# Patient Record
Sex: Female | Born: 1990 | Race: White | Hispanic: No | Marital: Single | Smoking: Never smoker
Health system: Southern US, Community
[De-identification: ages and names within clinical notes are randomized; demographics above are authoritative.]

## PROBLEM LIST (undated history)

## (undated) DIAGNOSIS — T8859XA Other complications of anesthesia, initial encounter: Secondary | ICD-10-CM

## (undated) DIAGNOSIS — Z9889 Other specified postprocedural states: Secondary | ICD-10-CM

## (undated) DIAGNOSIS — F411 Generalized anxiety disorder: Secondary | ICD-10-CM

## (undated) DIAGNOSIS — T4145XA Adverse effect of unspecified anesthetic, initial encounter: Secondary | ICD-10-CM

## (undated) DIAGNOSIS — R112 Nausea with vomiting, unspecified: Secondary | ICD-10-CM

## (undated) DIAGNOSIS — Z8489 Family history of other specified conditions: Secondary | ICD-10-CM

## (undated) DIAGNOSIS — F329 Major depressive disorder, single episode, unspecified: Secondary | ICD-10-CM

## (undated) DIAGNOSIS — F419 Anxiety disorder, unspecified: Secondary | ICD-10-CM

## (undated) DIAGNOSIS — K9041 Non-celiac gluten sensitivity: Secondary | ICD-10-CM

## (undated) DIAGNOSIS — K5900 Constipation, unspecified: Secondary | ICD-10-CM

## (undated) DIAGNOSIS — J302 Other seasonal allergic rhinitis: Secondary | ICD-10-CM

## (undated) DIAGNOSIS — K219 Gastro-esophageal reflux disease without esophagitis: Secondary | ICD-10-CM

## (undated) DIAGNOSIS — F32A Depression, unspecified: Secondary | ICD-10-CM

## (undated) HISTORY — PX: HERNIA REPAIR: SHX51

## (undated) HISTORY — DX: Anxiety disorder, unspecified: F41.9

## (undated) HISTORY — PX: WISDOM TOOTH EXTRACTION: SHX21

## (undated) HISTORY — DX: Generalized anxiety disorder: F41.1

## (undated) HISTORY — DX: Constipation, unspecified: K59.00

## (undated) HISTORY — PX: ANTERIOR CRUCIATE LIGAMENT REPAIR: SHX115

## (undated) HISTORY — DX: Non-celiac gluten sensitivity: K90.41

---
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
## 2012-04-12 HISTORY — PX: UPPER GI ENDOSCOPY: SHX6162

## 2012-06-08 ENCOUNTER — Encounter: Payer: Self-pay | Admitting: *Deleted

## 2012-07-03 ENCOUNTER — Encounter: Payer: Self-pay | Admitting: Pediatrics

## 2012-07-03 ENCOUNTER — Ambulatory Visit (INDEPENDENT_AMBULATORY_CARE_PROVIDER_SITE_OTHER): Payer: No Typology Code available for payment source | Admitting: Pediatrics

## 2012-07-03 VITALS — Temp 97.7°F | Ht 63.0 in | Wt 151.8 lb

## 2012-07-03 DIAGNOSIS — K9041 Non-celiac gluten sensitivity: Secondary | ICD-10-CM | POA: Insufficient documentation

## 2012-07-03 DIAGNOSIS — J309 Allergic rhinitis, unspecified: Secondary | ICD-10-CM

## 2012-07-03 DIAGNOSIS — K5900 Constipation, unspecified: Secondary | ICD-10-CM | POA: Insufficient documentation

## 2012-07-03 DIAGNOSIS — F411 Generalized anxiety disorder: Secondary | ICD-10-CM

## 2012-07-03 MED ORDER — CLONAZEPAM 0.5 MG PO TABS: 0.5000 mg | ORAL_TABLET | Freq: Two times a day (BID) | ORAL | Status: DC | PRN

## 2012-07-03 MED ORDER — CETIRIZINE HCL 10 MG PO TABS: 10.0000 mg | ORAL_TABLET | Freq: Every day | ORAL | Status: DC

## 2012-07-03 MED ORDER — CITALOPRAM HYDROBROMIDE 20 MG PO TABS: 20.0000 mg | ORAL_TABLET | Freq: Every evening | ORAL | Status: DC | PRN

## 2012-07-03 NOTE — Progress Notes (Signed)
Patient ID: [name], female   DOB: Feb 10, 1991, 22 y.o.   MRN: [id number]   22 y/o F here with mom. She was seen 1 m ago for multiple issues including anxiety and chronic GI/ pain/ constipation issues. She was started on Citalopram 20 mg and says she has not had any panic attacks since then. Feeling more calm. However she still has a hard time sleeping. She falls asleep around 11pm but wakes up 2-3 times at night. She sleeps for longer intervals since starting the citalopram. Mood is overall better.  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 The pt is due to see them tomorrow but has already cut out gluten this past week.  The pt has recently developed some throat clearing and dry cough. She has some nasal congestion and sneezing. Eyes are itchy. She reports she sometimes gets seasonal allergies that worsen this time of year. No meds for this condition.   Concerns at last visit: multiple.  GI: The pt has had lifelong issues with abdominal pain and on and off constipation. She sees GI.Marland Kitchen So far Gluten sensitivity and celiac had been r/o by blood work. An abd U/S was negative in March. She also has GERD and takes Nexium on and off. She belches frequently and has nausea. She si also on Probiotics and Co lace.Anxiety: The pt has been under much stress recently. She is in [facility] but last year went to [facility], where she became very stressed and got into a relationship that did not work out. Mom thinks this has had much to do with her stress. The pt admitts she is generally an anxious person but the last year was very bad. She is tearful often and has trouble sleeping. She worries about irrelevant issues. Denies any thoughts of harm to self or others. Acne: It has worsened this last year and she started OCPs which have somewhat helped. She says that she had only occasional pimples till 1year ago when it worsened and has remained  widespread.Headches: throbbing, 3-4 days a week. Frontal or band like. Increased in the last year.Weight is the same as last year but she says that it has fluctuated up and down.  PHYSICAL EXAM -  The patient is in no distress. Much more relaxed than last visit. Talkative and forthcoming. Articulate.Well groomed. Good hygiene. HEENT: Tympanic membranes with good light reflex and landmarks.   Nose with mild congestion and drainage, oropharynx without erythema or exudate. Pharynx with PND. Eyes without icteris but some mild injection.  EOMI.  Red reflexes are symmetric.  Corneal light reflex symmetrically  located bilaterally.  Normal cover/uncover testing.  Neck is supple, without lymphadenopathy or thyromegaly.  CV: Regular without murmur, rub, or gallop.  Normal palpatory exam.  LUNGS: CTA bilaterally, non-labored breathing.       ASSESSMENT: Anxiety/ depression: much better but sleep still an issue Chronic GI issues. 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 Headaches: most likely related to poor sleep and stress.Improved. Acne: controlled.   PLAN: Start Clonazepam for sleep prn. Continue Citalopram. Info about Gluten free diet. F/u with GI tomorrow. RTC in 6 m for f/u. Sooner if problems.  Current Outpatient Prescriptions  Medication Sig Dispense Refill  . cetirizine (ZYRTEC) 10 MG tablet Take 1 tablet (10 mg total) by mouth daily.  30 tablet  11  . citalopram (CELEXA) 20 MG tablet Take 1 tablet (  20 mg total) by mouth at bedtime as needed (insomnia).  30 tablet  0  . clonazePAM (KLONOPIN) 0.5 MG tablet Take 1 tablet (0.5 mg total) by mouth 2 (two) times daily as needed for anxiety.  30 tablet  0  . JUNEL FE 1.5/30 1.5-30 MG-MCG tablet       . omeprazole (PRILOSEC) 20 MG capsule       . polyethylene glycol powder (GLYCOLAX/MIRALAX) powder Take 17 g by mouth daily.       No current facility-administered medications for this visit.

## 2012-07-03 NOTE — Patient Instructions (Signed)
Gluten-Free Diet  Gluten is a protein found in many grains. Gluten is present in wheat, rye, and barley. Gluten from wheat, rye, and barley protein interferes with the absorption of food in people with gluten sensitivity. It may also cause intestinal injury when eaten by individuals with gluten sensitivity.    A sample piece (biopsy) of the small intestine is usually required for a positive diagnosis of gluten sensitivity. Dietary treatment consists of eliminating foods and food ingredients from wheat, rye, and barley. When these are taken out of the diet completely, most people regain function of the small intestine.  Strict compliance is important even during symptom-free periods. People with gluten sensitivity need to be on a gluten-free diet for a lifetime. During the first stages of treatment, some people will also need to restrict dairy products that contain lactose, which is a naturally occurring sugar. Lactose is difficult to absorb when the small intestines are damaged (lactose intolerance).    WHO NEEDS THIS DIET  Some people who have certain diseases need to be on a gluten-free diet. These diseases include:   Celiac disease.   Nontropical sprue.   Gluten-sensitive enteropathy.   Dermatitis herpetiformis.  SPECIAL NOTES   Read all labels because gluten may have been added as an incidental ingredient. Words to check for on the label include: flour, starch, durum flour, graham flour, phosphated flour, self-rising flour, semolina, farina, modified food starch, cereal, thickening, fillers, emulsifiers, any kind of malt flavoring, and hydrolyzed vegetable protein. A registered dietician can help you identify possible harmful ingredients in the foods you normally eat.   If you are not sure whether an ingredient contains gluten, check with the manufacturer. Note that some manufacturers may change ingredients without notice. Always read labels.     Since flour and cereal products are often used in the preparation of foods, it is important to be aware of the methods of preparation used, as well as the foods themselves. This is especially true when you are dining out.  Starches   Allowed: Only those prepared from arrowroot, corn, potato, rice, and bean flours. Rice wafers(*), pure cornmeal tortillas, popcorn, some crackers, and chips(*). Hot cereals made from cornmeal. Ask your dietician which specific hot and cold cereals are allowed. White or sweet potatoes, yams, hominy, rice or wild rice, and special gluten-free pasta. Some oriental rice noodles or bean noodles.   Avoid: All wheat and rye cereals, wheat germ, barley, bran, graham, malt, bulgur, and millet(-). NOTE: Avoid cereals containing malt as a flavoring, such as rice cereal. Regular noodles, spaghetti, macaroni, and most packaged rice mixes(*). All others containing wheat, rye, or barley.  Vegetables   Allowed: All plain, fresh, frozen, or canned vegetables.   Avoid: Creamed vegetables(*) and vegetables canned in sauces(*). Any prepared with wheat, rye, or barley.  Fruit   Allowed: All fresh, frozen, canned, or dried fruits. Fruit juices.   Avoid: Thickened or prepared fruits and some pie fillings(*).  Meat and Meat Substitutes   Allowed: Meat, fish, poultry, or eggs prepared without added wheat, rye, or barley. Luncheon meat(*), frankfurters(*), and pure meat. All aged cheese and processed cheese products(*). Cottage cheese(+) and cream cheese(+). Dried beans, dried peas, and lentils.   Avoid: Any meat or meat alternate containing wheat, rye, barley, or gluten stabilizers. Bread-containing products, such as Swiss steak, croquettes, and meatloaf. Tuna canned in vegetable broth(*); turkey with HVP injected as part of the basting; any cheese product containing oat gum as an ingredient.  Milk     Allowed: Milk. Yogurt made with allowed ingredients(*).    Avoid: Commercial chocolate milk which may have cereal added(*). Malted milk.  Soups and Combination Foods   Allowed: Homemade broth and soups made with allowed ingredients; some canned or frozen soups are allowed(*). Combination or prepared foods that do not contain gluten(*). Read labels.   Avoid: All soups containing wheat, rye, or barley flour. Bouillon and bouillon cubes that contain hydrolyzed vegetable protein (HVP). Combination or prepared foods that contain gluten(*).  Desserts   Allowed:  Custard, some pudding mixes(*), homemade puddings from cornstarch, rice, and tapioca. Gelatin desserts, ices, and sherbet(*). Cake, cookies, and other desserts prepared with allowed flours. Some commercial ice creams(*). Ask your dietician about specific brands of dessert that are allowed.   Avoid: Cakes, cookies, doughnuts, and pastries that are prepared with wheat, rye, or barley flour. Some commercial ice creams(*), ice cream flavors which contain cookies, crumbs, or cheesecake(*). Ice cream cones. All commercially prepared mixes for cakes, cookies, and other desserts(*). Bread pudding and other puddings thickened with flour.  Sweets   Allowed: Sugar, honey, syrup(*), molasses, jelly, jam, plain hard candy, marshmallows, gumdrops, homemade candies free from wheat, rye, or barley. Coconut.   Avoid: Commercial candies containing wheat, rye, or barley(*). Certain buttercrunch toffees are dusted with wheat flour. Ask your dietician about specific brands that are not allowed. Chocolate-coated nuts, which are often rolled in flour.  Fats and Oils   Allowed: Butter, margarine, vegetable oil, sour cream(+), whipping cream, shortening, lard, cream, mayonnaise(*). Some commercial salad dressings(*). Peanut butter.   Avoid: Some commercial salad dressings(*).  Beverages    Allowed: Coffee (regular or decaffeinated), tea, herbal tea (read label to be sure that no wheat flour has been added). Carbonated beverages and some root beers(*). Wine, sake, and distilled spirits, such as gin, vodka, and whiskey.   Avoid:  Certain cereal beverages. Ask your dietician about specific brands that are not allowed. Beer (unless gluten-free), ale, malted milk, and some root beers, wine, and sake.  Condiments/ Miscellaneous   Allowed: Salt, pepper, herbs, spices, extracts, and food colorings. Monosodium glutamate (MSG). Cider, rice, and wine vinegar. Baking soda and baking powder. Certain soy sauces. Ask your dietician about specific brands that are allowed. Nuts, coconut, chocolate, and pure cocoa powder.   Avoid: Some curry powder(*), some dry seasoning mixes(*), some gravy extracts(*), some meat sauces(*), some catsup(*), some prepared mustard(*), horseradish(*), some soy sauce(*), chip dips(*), and some chewing gum(*). Yeast extract (contains barley). Caramel color (may contain malt). Ask your dietician about specific brands of condiments to avoid.  Flour and Thickening Agents   Allowed: Arrowroot starch (A); Corn bran (B); Corn flour (B,C,D); Corn germ (B); Cornmeal (B,C,D); Corn starch (A); Potato flour (B,C,E); Potato starch flour (B,C,E); Rice bran (B); Rice flours: Plain, brown (B,C,D,E), and Sweet (A,B,C,F). Rice polish (B,C,G); Soy flour (B,C,G); Tapioca starch (A).  The flour and thickening agents described above are good for:  (A) Good thickening agent  (B) Good when combined with other flours  (C) Best combined with milk and eggs in baked products  (D) Best in grainy-textured products  (E) Produces drier product than other flours  (F) Produces moister product than other flours  (G) Adds distinct flavor to product. Use in moderation.  (*) Check labels and investigate any questionable ingredients.    (-) Additional research is needed before this product can be recommended.   (+) Check vegetable gum used.  SAMPLE MEAL PLAN  Breakfast    Orange   juice.   Banana.   Rice or corn cereal.   Toast (gluten-free bread).   Heart-healthy tub margarine.   Jam.   Milk.   Coffee or tea.  Lunch   Chicken salad sandwich (with gluten-free bread and mayonnaise).   Sliced tomatoes.   Heart-healthy tub margarine.   Apple.   Milk.   Coffee or tea.  Dinner   Roast beef.   Baked potato.   Broccoli.   Lettuce salad with gluten-free dressing.   Gluten-free bread.   Custard.   Heart-healthy margarine.   Coffee or tea.  These meal plans are provided as samples. Your daily meal plans will vary.  Document Released: 03/29/2005 Document Revised: 09/28/2011 Document Reviewed: 05/09/2011  [employer] Patient Information 2013 [employer].

## 2012-08-25 ENCOUNTER — Other Ambulatory Visit: Payer: Self-pay | Admitting: *Deleted

## 2012-08-25 DIAGNOSIS — F411 Generalized anxiety disorder: Secondary | ICD-10-CM

## 2012-08-25 MED ORDER — CITALOPRAM HYDROBROMIDE 20 MG PO TABS: 20.0000 mg | ORAL_TABLET | Freq: Every evening | ORAL | Status: DC | PRN

## 2012-08-28 ENCOUNTER — Other Ambulatory Visit: Payer: Self-pay | Admitting: *Deleted

## 2012-08-28 NOTE — Telephone Encounter (Signed)
Refill was sent over to pharmacy on 08/25/12.

## 2012-09-01 ENCOUNTER — Encounter: Payer: Self-pay | Admitting: Pediatrics

## 2012-09-01 ENCOUNTER — Ambulatory Visit (INDEPENDENT_AMBULATORY_CARE_PROVIDER_SITE_OTHER): Payer: No Typology Code available for payment source | Admitting: Pediatrics

## 2012-09-01 VITALS — Temp 98.6°F | Wt 156.4 lb

## 2012-09-01 DIAGNOSIS — J069 Acute upper respiratory infection, unspecified: Secondary | ICD-10-CM

## 2012-09-01 DIAGNOSIS — H01139 Eczematous dermatitis of unspecified eye, unspecified eyelid: Secondary | ICD-10-CM

## 2012-09-01 DIAGNOSIS — F411 Generalized anxiety disorder: Secondary | ICD-10-CM | POA: Insufficient documentation

## 2012-09-01 DIAGNOSIS — H01133 Eczematous dermatitis of right eye, unspecified eyelid: Secondary | ICD-10-CM

## 2012-09-01 DIAGNOSIS — J029 Acute pharyngitis, unspecified: Secondary | ICD-10-CM

## 2012-09-01 DIAGNOSIS — G4700 Insomnia, unspecified: Secondary | ICD-10-CM

## 2012-09-01 HISTORY — DX: Generalized anxiety disorder: F41.1

## 2012-09-01 MED ORDER — HYDROXYZINE HCL 25 MG PO TABS: ORAL_TABLET | ORAL | Status: DC

## 2012-09-01 MED ORDER — HYDROCORTISONE 1 % EX CREA: TOPICAL_CREAM | Freq: Two times a day (BID) | CUTANEOUS | Status: AC

## 2012-09-01 NOTE — Progress Notes (Signed)
Patient ID: [name], female   DOB: [birth year], 22 y.o.   MRN: [id number]  Subjective:     Patient ID: [name], female   DOB: April 30, 1990, 22 y.o.   MRN: [id number]  HPI: 22 y/o F is here for 4-5 days of nasal congestion, rhinorrhea and ST. She reports initial low grade temps to 100.2. The ST and fevers have now resolved. No GI symptoms.   The pt also has a patch of dry skin on the lower and upper R eyelids. No increased tears or pain. No discharge. She has  asimilar dry area in the webspace between the left ring and middle fingers.   The pt has anxiety and GI issues recently diagnosed as Gluten enteropathy. She has been doing well, mood wise on the Citalopram. However, she is still having trouble sleeping, specifically staying asleep, even when she tried Clonazepam. Melatonin did not help.    ROS:  Apart from the symptoms reviewed above, there are no other symptoms referable to all systems reviewed.   Physical Examination  Temperature 98.6 F (37 C), temperature source Temporal, weight 156 lb 6 oz (70.931 kg), last menstrual period 08/18/2012. General: Alert, NAD HEENT: TM's - clear, Throat - clear, Neck - FROM, no meningismus, Sclera - clear. Nose with mild swelling and clear discharge. LYMPH NODES: No LN noted LUNGS: CTA B CV: RRR without Murmurs ABD: Soft, NT, +BS, No HSM SKIN: Clear, the lids of the R eye are scaling and slightly erythematous. The web spaces of the L ring and middle finger show scaling and redness. Nos welling. Remainder of hands and feet are wnl.  No results found. No results found for this or any previous visit (from the past 240 hour(s)). 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  Plan:   Reassurance. OTC meds for symptomatic URI treatment. HC for skin. Try Atarax for sleep. RTC prn.  Current Outpatient Prescriptions  Medication Sig Dispense Refill  . cetirizine (ZYRTEC) 10 MG tablet Take 1 tablet (10 mg total) by mouth daily.  30 tablet  11  . citalopram (CELEXA) 20 MG tablet Take 1 tablet (20 mg total) by mouth at bedtime as needed (insomnia).  30 tablet  2  . clonazePAM (KLONOPIN) 0.5 MG tablet Take 1 tablet (0.5 mg total) by mouth 2 (two) times daily as needed for anxiety.  30 tablet  0  . DEXILANT 60 MG capsule       . JUNEL FE 1.5/30 1.5-30 MG-MCG tablet       . omeprazole (PRILOSEC) 20 MG capsule       . polyethylene glycol powder (GLYCOLAX/MIRALAX) powder Take 17 g by mouth daily.      . hydrocortisone cream 1 % Apply topically 2 (two) times daily.  30 g  0  . hydrOXYzine (ATARAX/VISTARIL) 25 MG tablet Take 1-2 tabs PO QHS PRN insomnia  30 tablet  2   No current facility-administered medications for this visit.

## 2012-09-01 NOTE — Patient Instructions (Signed)

## 2012-11-13 ENCOUNTER — Encounter: Payer: Self-pay | Admitting: Pediatrics

## 2012-11-13 ENCOUNTER — Ambulatory Visit (INDEPENDENT_AMBULATORY_CARE_PROVIDER_SITE_OTHER): Payer: No Typology Code available for payment source | Admitting: Pediatrics

## 2012-11-13 VITALS — BP 104/68 | HR 76 | Temp 98.1°F | Wt 164.5 lb

## 2012-11-13 DIAGNOSIS — J309 Allergic rhinitis, unspecified: Secondary | ICD-10-CM

## 2012-11-13 DIAGNOSIS — F411 Generalized anxiety disorder: Secondary | ICD-10-CM

## 2012-11-13 DIAGNOSIS — F419 Anxiety disorder, unspecified: Secondary | ICD-10-CM

## 2012-11-13 NOTE — Patient Instructions (Signed)
Come to Clinic when home from college

## 2012-11-13 NOTE — Progress Notes (Signed)
Patient ID: [name], female   DOB: 08-15-1990, 22 y.o.   MRN: [id number] Patient ID: [name], female   DOB: 29-Jun-1990, 22 y.o.   MRN: [id number]  Subjective:     Patient ID: [name], female   DOB: 1990/05/11, 22 y.o.   MRN: [id number]  HPI: 22 y/o F is here for follow up of Celexa and anxiety. She reports she has had significant improvement since starting Celexa 20 mg several months ago. No panic attacks. Initially there was a sleep problem, but she says that over the summer this has resolved. She is moving away to college next month. (See previous notes)  The pt has anxiety and GI issues recently diagnosed as Gluten enteropathy. She mostly stays on the Gluten free diet. Weight is up 12 lbs.    ROS:  Apart from the symptoms reviewed above, there are no other symptoms referable to all systems reviewed.   Physical Examination  Blood pressure 104/68, pulse 76, temperature 98.1 F (36.7 C), temperature source Temporal, weight 164 lb 8 oz (74.617 kg), last menstrual period 11/10/2012. General: Alert, NAD HEENT: TM's - clear, Throat - clear, Neck - FROM, no meningismus, Sclera - clear. Nose with mild swelling and clear discharge. LYMPH NODES: No LN noted LUNGS: CTA B CV: RRR without Murmurs SKIN: Clear,  No results found. No results found for this or any previous visit (from the past 240 hour(s)). No results found for this or any previous visit (from the past 48 hour(s)).  Assessment:   Anxiety with insomnia: improved/ stable Gluten Enteropathy. Mild AR  Plan:   Reassurance.  Continue current meds. Discussed that sleep and diet may be disturbed away from home. Stress management discussed. Continue Junel and STD risks discussed. Instructed the pt to [facility] herself [facility] the [facility] when she gets to school. RTC prn, when she is home for vacation in 4-6 months. Mom will fill Rx`s for her.  Current Outpatient Prescriptions  Medication Sig Dispense  Refill  . cetirizine (ZYRTEC) 10 MG tablet Take 1 tablet (10 mg total) by mouth daily.  30 tablet  11  . citalopram (CELEXA) 20 MG tablet Take 1 tablet (20 mg total) by mouth at bedtime as needed (insomnia).  30 tablet  2  . DEXILANT 60 MG capsule       . JUNEL FE 1.5/30 1.5-30 MG-MCG tablet       . omeprazole (PRILOSEC) 20 MG capsule       . clobetasol cream (TEMOVATE) 0.05 %       . clonazePAM (KLONOPIN) 0.5 MG tablet Take 1 tablet (0.5 mg total) by mouth 2 (two) times daily as needed for anxiety.  30 tablet  0  . desonide (DESOWEN) 0.05 % ointment       . hydrocortisone cream 1 % Apply topically 2 (two) times daily.  30 g  0  . hydrOXYzine (ATARAX/VISTARIL) 25 MG tablet Take 1-2 tabs PO QHS PRN insomnia  30 tablet  2  . polyethylene glycol powder (GLYCOLAX/MIRALAX) powder Take 17 g by mouth daily.       No current facility-administered medications for this visit.

## 2013-03-19 ENCOUNTER — Other Ambulatory Visit: Payer: Self-pay | Admitting: Pediatrics

## 2013-03-19 DIAGNOSIS — F411 Generalized anxiety disorder: Secondary | ICD-10-CM

## 2013-03-19 MED ORDER — CITALOPRAM HYDROBROMIDE 20 MG PO TABS: 20.0000 mg | ORAL_TABLET | Freq: Every evening | ORAL | Status: DC | PRN

## 2013-04-02 ENCOUNTER — Encounter: Payer: Self-pay | Admitting: Family Medicine

## 2013-04-02 ENCOUNTER — Ambulatory Visit (INDEPENDENT_AMBULATORY_CARE_PROVIDER_SITE_OTHER): Payer: No Typology Code available for payment source | Admitting: Family Medicine

## 2013-04-02 VITALS — BP 100/68 | HR 64 | Temp 98.5°F | Resp 20 | Ht 63.0 in | Wt 158.4 lb

## 2013-04-02 DIAGNOSIS — N926 Irregular menstruation, unspecified: Secondary | ICD-10-CM

## 2013-04-02 DIAGNOSIS — L708 Other acne: Secondary | ICD-10-CM

## 2013-04-02 DIAGNOSIS — F411 Generalized anxiety disorder: Secondary | ICD-10-CM

## 2013-04-02 DIAGNOSIS — L709 Acne, unspecified: Secondary | ICD-10-CM | POA: Insufficient documentation

## 2013-04-02 DIAGNOSIS — K5900 Constipation, unspecified: Secondary | ICD-10-CM

## 2013-04-02 DIAGNOSIS — K9041 Non-celiac gluten sensitivity: Secondary | ICD-10-CM

## 2013-04-02 DIAGNOSIS — Z23 Encounter for immunization: Secondary | ICD-10-CM

## 2013-04-02 DIAGNOSIS — K900 Celiac disease: Secondary | ICD-10-CM

## 2013-04-02 NOTE — Patient Instructions (Signed)
Health Maintenance, 18- to 21-Year-Old SCHOOL PERFORMANCE After high school completion, the young adult may be attending college, technical or vocational school, or entering the military or the work force. SOCIAL AND EMOTIONAL DEVELOPMENT The young adult establishes adult relationships and explores sexual identity. Young adults may be living at home or in a college dorm or apartment. Increasing independence is important with young adults. Throughout these years, young adults should assume responsibility of their own health care. RECOMMENDED IMMUNIZATIONS  Influenza vaccine.  All adults should be immunized every year.  All adults, including pregnant women and people with hives-only allergy to eggs can receive the inactivated influenza (IIV) vaccine.  Adults aged 20 49 years can receive the recombinant influenza (RIV) vaccine. The RIV vaccine does not contain any egg protein.  Tetanus, diphtheria, and acellular pertussis (Td, Tdap) vaccine.  Pregnant women should receive 1 dose of Tdap vaccine during each pregnancy. The dose should be obtained regardless of the length of time since the last dose. Immunization is preferred during the 27th to 36th week of gestation.  An adult who has not previously received Tdap or who does not know his or her vaccine status should receive 1 dose of Tdap. This initial dose should be followed by tetanus and diphtheria toxoids (Td) booster doses every 10 years.  Adults with an unknown or incomplete history of completing a 3-dose immunization series with Td-containing vaccines should begin or complete a primary immunization series including a Tdap dose.  Adults should receive a Td booster every 10 years.  Varicella vaccine.  An adult without evidence of immunity to varicella should receive 2 doses or a second dose if he or she has previously received 1 dose.  Pregnant females who do not have evidence of immunity should receive the first dose after pregnancy.  This first dose should be obtained before leaving the health care facility. The second dose should be obtained 4 8 weeks after the first dose.  Human papillomavirus (HPV) vaccine.  Females aged 13 26 years who have not received the vaccine previously should obtain the 3-dose series.  The vaccine is not recommended for use in pregnant females. However, pregnancy testing is not needed before receiving a dose. If a female is found to be pregnant after receiving a dose, no treatment is needed. In that case, the remaining doses should be delayed until after the pregnancy.  Males aged 13 20 years who have not received the vaccine previously should receive the 3-dose series. Males aged 22 20 years may be immunized.  Immunization is recommended through the age of 20 years for any female who has sex with males and did not get any or all doses earlier.  Immunization is recommended for any person with an immunocompromised condition through the age of 20 years if he or she did not get any or all doses earlier.  During the 3-dose series, the second dose should be obtained 4 8 weeks after the first dose. The third dose should be obtained 24 weeks after the first dose and 16 weeks after the second dose.  Measles, mumps, and rubella (MMR) vaccine.  Adults born in 1957 or later should have 1 or more doses of MMR vaccine unless there is a contraindication to the vaccine or there is laboratory evidence of immunity to each of the three diseases.  A routine second dose of MMR vaccine should be obtained at least 28 days after the first dose for students attending postsecondary schools, health care workers, or international travelers.    For females of childbearing age, rubella immunity should be determined. If there is no evidence of immunity, females who are not pregnant should be vaccinated. If there is no evidence of immunity, females who are pregnant should delay immunization until after pregnancy.  Pneumococcal  13-valent conjugate (PCV13) vaccine.  When indicated, a person who is uncertain of his or her immunization history and has no record of immunization should receive the PCV13 vaccine.  An adult aged 19 years or older who has certain medical conditions and has not been previously immunized should receive 1 dose of PCV13 vaccine. This PCV13 should be followed with a dose of pneumococcal polysaccharide (PPSV23) vaccine. The PPSV23 vaccine dose should be obtained at least 8 weeks after the dose of PCV13 vaccine.  An adult aged 19 years or older who has certain medical conditions and previously received 1 or more doses of PPSV23 vaccine should receive 1 dose of PCV13. The PCV13 vaccine dose should be obtained 1 or more years after the last PPSV23 vaccine dose.  Pneumococcal polysaccharide (PPSV23) vaccine.  When PCV13 is also indicated, PCV13 should be obtained first.  An adult younger than age 65 years who has certain medical conditions should be immunized.  Any person who resides in a nursing home or long-term care facility should be immunized.  An adult smoker should be immunized.  People with an immunocompromised condition and certain other conditions should receive both PCV13 and PPSV23 vaccines.  People with human immunodeficiency virus (HIV) infection should be immunized as soon as possible after diagnosis.  Immunization during chemotherapy or radiation therapy should be avoided.  Routine use of PPSV23 vaccine is not recommended for American Indians, [place] Natives, or people younger than 65 years unless there are medical conditions that require PPSV23 vaccine.  When indicated, people who have unknown immunization and have no record of immunization should receive PPSV23 vaccine.  One-time revaccination 5 years after the first dose of PPSV23 is recommended for people aged 19 64 years who have chronic kidney failure, nephrotic syndrome, asplenia, or immunocompromised  conditions.  Meningococcal vaccine.  Adults with asplenia or persistent complement component deficiencies should receive 2 doses of quadrivalent meningococcal conjugate (MenACWY-D) vaccine. The doses should be obtained at least 2 months apart.  Microbiologists working with certain meningococcal bacteria, military recruits, people at risk during an outbreak, and people who travel to or live in countries with a high rate of meningitis should be immunized.  A first-year college student up through age 21 years who is living in a residence hall should receive a dose if he or she did not receive a dose on or after his or her 16th birthday.  Adults who have certain high-risk conditions should receive one or more doses of vaccine.  Hepatitis A vaccine.  Adults who wish to be protected from this disease, have certain high-risk conditions, work with hepatitis A-infected animals, work in hepatitis A research labs, or travel to or work in countries with a high rate of hepatitis A should be immunized.  Adults who were previously unvaccinated and who anticipate close contact with an international adoptee during the first 60 days after arrival in the [place] from a country with a high rate of hepatitis A should be immunized.  Hepatitis B vaccine.  Adults who wish to be protected from this disease, have certain high-risk conditions, may be exposed to blood or other infectious body fluids, are household contacts or sex partners of hepatitis B positive people, are clients or workers in   certain care facilities, or travel to or work in countries with a high rate of hepatitis B should be immunized.  Haemophilus influenzae type b (Hib) vaccine.  A previously unvaccinated person with asplenia or sickle cell disease or having a scheduled splenectomy should receive 1 dose of Hib vaccine.  Regardless of previous immunization, a recipient of a hematopoietic stem cell transplant should receive a 3-dose series 6  12 months after his or her successful transplant.  Hib vaccine is not recommended for adults with HIV infection. TESTING Annual screening for vision and hearing problems is recommended. Vision should be screened objectively at least once between 18 21 years of age. The young adult may be screened for anemia or tuberculosis. Young adults should have a blood test to check for high cholesterol during this time period. Young adults should be screened for use of alcohol and drugs. If the young adult is sexually active, screening for sexually transmitted infections, pregnancy, or HIV may be performed.  NUTRITION AND ORAL HEALTH  Adequate calcium intake is important. Consume 3 servings of low-fat milk and dairy products daily. For those who do not drink milk or consume dairy products, calcium enriched foods, such as juice, bread, or cereal, dark, leafy greens, or canned fish are alternate sources of calcium.  Drink plenty of water. Limit fruit juice to 8 12 ounces (240 360 mL) each day. Avoid sugary beverages or sodas.  Discourage skipping meals, especially breakfast. Young adults should eat a good variety of vegetables and fruits, as well as lean meats.  Avoid foods high in fat, salt, or sugar, such as candy, chips, and cookies.  Encourage young adults to participate in meal planning and preparation.  Eat meals together as a family whenever possible. Encourage conversation at mealtime.  Limit fast food choices and eating out at restaurants.  Brush teeth twice a day and floss.  Schedule dental exams twice a year. SLEEP Regular sleep habits are important. PHYSICAL, SOCIAL, AND EMOTIONAL DEVELOPMENT  One hour of regular physical activity daily is recommended. Continue to participate in sports.  Encourage young adults to develop their own interests and consider community service or volunteerism.  Provide guidance to the young adult in making decisions about college and work plans.  Make sure  that young adults know that they should never be in a situation that makes them uncomfortable, and they should tell partners if they do not want to engage in sexual activity.  Talk to the young adult about body image. Eating disorders may be noted at this time. Young adults may also be concerned about being overweight. Monitor the young adult for weight gain or loss.  Mood disturbances, depression, anxiety, alcoholism, or attention problems may be noted in young adults. Talk to the caregiver if there are concerns about mental illness.  Negotiate limit setting and independent decision making.  Encourage the young adult to handle conflict without physical violence.  Avoid loud noises which may impair hearing.  Limit television and computer time to 2 hours each day. Individuals who engage in excessive sedentary activity are more likely to become overweight. RISK BEHAVIORS  Sexually active young adults need to take precautions against pregnancy and sexually transmitted infections. Talk to young adults about contraception.  Provide a tobacco-free and drug-free environment for the young adult. Talk to the young adult about drug, tobacco, and alcohol use among friends or at friend's homes. Make sure the young adult knows that smoking tobacco or marijuana and taking drugs have health consequences and   may impact brain development.  Teach the young adult about appropriate use of over-the-counter or prescription medicines.  Establish guidelines for driving and for riding with friends.  Talk to young adults about the risks of drinking and driving or boating. Encourage the young adult to call you if he or she or friends have been drinking or using drugs.  Remind young adults to wear seat belts at all times in cars and life vests in boats.  Young adults should always wear a properly fitted helmet when they are riding a bicycle.  Use caution with all-terrain vehicles (ATVs) or other motorized  vehicles.  Do not keep handguns in the home. (If you do, the gun and ammunition should be locked separately and out of the young adult's access.)  Equip your home with smoke detectors and change the batteries regularly. Make sure all family members know the fire escape plans for your home.  Teach young adults not to swim alone and not to dive in shallow water.  All individuals should wear sunscreen when out in the sun. This minimizes sunburning. WHAT'S NEXT? Young adults should visit their pediatrician or family physician yearly. By young adulthood, health care should be transitioned to a family physician or internal medicine specialist. Sexually active females may want to begin annual physical exams with a gynecologist. Document Released: 06/24/2006 Document Revised: 07/24/2012 Document Reviewed: 07/14/2006 [employer] Patient Information 2014 [employer].  

## 2013-04-02 NOTE — Progress Notes (Signed)
   Subjective:    Patient ID: [name], female    DOB: 28-Mar-1991, 22 y.o.   MRN: [id number]  HPI  Pt here for f/u on 5 chronic medical problems:  1 - acne - on junel for this, stable. She rememebrs to take her ocp daily but not at the same time.   2 - constipation - improved with colace which she takes 2 times daily and occasionally increased to 3-4 times daily deoending on need. BMs once dialy, usually unlabored. No blood or pain. Has not needed miralax for several months.   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 Says she does almost all of her own cooking. This has helped greatly. Currently no sx.   4 - anxiety - doing well on celexa daily and klonopin twice daily. Discussed with the pt that a good goal for her would be to be able to decrease the klonopin. Currently she does not feel this is a good option. She is doing "ok" in school, passing her classes and has friends. She denies si/hi and says she feels the anxiety is controlled with her med most of the time. Denies depression or sx of poor energy, poor sleep, poor interest in usual activities, excessive feelings of guilt, poor concentration, of psychomotor slowing.   5 - irregular periods - another reason she is on the junel. For the most part resolved on ocps. No bad cramping or other unwanted side effects. Pt says she is not sexually active.      Review of Systems A 12 point review of systems is negative except as per hpi.      Objective:   Physical Exam  Nursing note and vitals reviewed. Constitutional: She is oriented to person, place, and time. She appears well-developed and well-nourished.  HENT:  Right Ear: External ear normal.  Left Ear: External ear normal.  Nose: Nose normal.  Mouth/Throat: Oropharynx is clear and moist. No oropharyngeal exudate.  Eyes: Conjunctivae are normal. Pupils are equal, round, and reactive to light.  Neck: Normal range of motion. Neck supple. No thyromegaly  present.  Cardiovascular: Normal rate, regular rhythm and normal heart sounds.   Pulmonary/Chest: Effort normal and breath sounds normal.  Abdominal: Soft. Bowel sounds are normal. She exhibits no distension. There is no tenderness. There is no rebound.  Lymphadenopathy:    She has no cervical adenopathy.  Neurological: She is alert and oriented to person, place, and time. She has normal reflexes.  Skin: Skin is warm and dry.  Psychiatric: She has a normal mood and affect. Her behavior is normal.        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   We discussed other options of birth control if she has ha hard time remembering pills. We discussed skyla and nexplanon. These probably would not afford her the acne benefits and periods would be light to nonexistant but not regular. She chooses to hold off for now.

## 2013-04-23 ENCOUNTER — Other Ambulatory Visit: Payer: Self-pay | Admitting: *Deleted

## 2013-04-23 ENCOUNTER — Telehealth: Payer: Self-pay | Admitting: Family Medicine

## 2013-04-23 DIAGNOSIS — F411 Generalized anxiety disorder: Secondary | ICD-10-CM

## 2013-04-23 DIAGNOSIS — J309 Allergic rhinitis, unspecified: Secondary | ICD-10-CM

## 2013-04-23 MED ORDER — CETIRIZINE HCL 10 MG PO TABS: 10.0000 mg | ORAL_TABLET | Freq: Every day | ORAL | Status: AC

## 2013-04-23 MED ORDER — CITALOPRAM HYDROBROMIDE 20 MG PO TABS: 20.0000 mg | ORAL_TABLET | Freq: Every day | ORAL | Status: DC

## 2013-04-23 NOTE — Telephone Encounter (Signed)
Spoke with mom. Refills escribed to [facility] [place], [place]

## 2013-08-19 ENCOUNTER — Other Ambulatory Visit: Payer: Self-pay | Admitting: Pediatrics

## 2014-04-15 ENCOUNTER — Other Ambulatory Visit (HOSPITAL_COMMUNITY): Payer: Self-pay | Admitting: Family Medicine

## 2014-04-15 DIAGNOSIS — R5383 Other fatigue: Secondary | ICD-10-CM

## 2014-04-15 DIAGNOSIS — R10811 Right upper quadrant abdominal tenderness: Secondary | ICD-10-CM

## 2014-04-15 DIAGNOSIS — R109 Unspecified abdominal pain: Secondary | ICD-10-CM

## 2014-04-16 ENCOUNTER — Ambulatory Visit (HOSPITAL_COMMUNITY)
Admission: RE | Admit: 2014-04-16 | Discharge: 2014-04-16 | Disposition: A | Discharge status: Home / Self Care | Payer: BLUE CROSS/BLUE SHIELD | Source: Ambulatory Visit | Attending: Family Medicine | Admitting: Family Medicine

## 2014-04-16 DIAGNOSIS — K828 Other specified diseases of gallbladder: Secondary | ICD-10-CM | POA: Diagnosis not present

## 2014-04-16 DIAGNOSIS — R1011 Right upper quadrant pain: Secondary | ICD-10-CM | POA: Diagnosis present

## 2014-04-16 DIAGNOSIS — R5383 Other fatigue: Secondary | ICD-10-CM

## 2014-04-16 DIAGNOSIS — R10811 Right upper quadrant abdominal tenderness: Secondary | ICD-10-CM

## 2014-04-16 DIAGNOSIS — R109 Unspecified abdominal pain: Secondary | ICD-10-CM

## 2014-04-19 ENCOUNTER — Other Ambulatory Visit (HOSPITAL_COMMUNITY): Payer: Self-pay | Admitting: Family Medicine

## 2014-04-19 DIAGNOSIS — R10811 Right upper quadrant abdominal tenderness: Secondary | ICD-10-CM

## 2014-04-19 DIAGNOSIS — K838 Other specified diseases of biliary tract: Secondary | ICD-10-CM

## 2014-04-23 ENCOUNTER — Encounter (HOSPITAL_COMMUNITY): Payer: BLUE CROSS/BLUE SHIELD

## 2014-06-07 ENCOUNTER — Other Ambulatory Visit (INDEPENDENT_AMBULATORY_CARE_PROVIDER_SITE_OTHER): Payer: Self-pay | Admitting: Surgery

## 2014-07-01 ENCOUNTER — Encounter (HOSPITAL_COMMUNITY)
Admission: RE | Admit: 2014-07-01 | Discharge: 2014-07-01 | Disposition: A | Discharge status: Home / Self Care | Payer: BLUE CROSS/BLUE SHIELD | Source: Ambulatory Visit | Attending: Surgery | Admitting: Surgery

## 2014-07-01 ENCOUNTER — Encounter (HOSPITAL_COMMUNITY): Payer: Self-pay

## 2014-07-01 ENCOUNTER — Other Ambulatory Visit (HOSPITAL_COMMUNITY): Payer: BLUE CROSS/BLUE SHIELD

## 2014-07-01 DIAGNOSIS — F419 Anxiety disorder, unspecified: Secondary | ICD-10-CM | POA: Diagnosis not present

## 2014-07-01 DIAGNOSIS — K811 Chronic cholecystitis: Secondary | ICD-10-CM | POA: Diagnosis present

## 2014-07-01 DIAGNOSIS — K219 Gastro-esophageal reflux disease without esophagitis: Secondary | ICD-10-CM | POA: Diagnosis not present

## 2014-07-01 DIAGNOSIS — F1099 Alcohol use, unspecified with unspecified alcohol-induced disorder: Secondary | ICD-10-CM | POA: Diagnosis not present

## 2014-07-01 DIAGNOSIS — F1590 Other stimulant use, unspecified, uncomplicated: Secondary | ICD-10-CM | POA: Diagnosis not present

## 2014-07-01 DIAGNOSIS — Z886 Allergy status to analgesic agent status: Secondary | ICD-10-CM | POA: Diagnosis not present

## 2014-07-01 HISTORY — DX: Major depressive disorder, single episode, unspecified: F32.9

## 2014-07-01 HISTORY — DX: Other specified postprocedural states: R11.2

## 2014-07-01 HISTORY — DX: Gastro-esophageal reflux disease without esophagitis: K21.9

## 2014-07-01 HISTORY — DX: Depression, unspecified: F32.A

## 2014-07-01 HISTORY — DX: Adverse effect of unspecified anesthetic, initial encounter: T41.45XA

## 2014-07-01 HISTORY — DX: Other seasonal allergic rhinitis: J30.2

## 2014-07-01 HISTORY — DX: Nausea with vomiting, unspecified: Z98.890

## 2014-07-01 HISTORY — DX: Other complications of anesthesia, initial encounter: T88.59XA

## 2014-07-01 HISTORY — DX: Family history of other specified conditions: Z84.89

## 2014-07-01 LAB — CBC
HEMATOCRIT: 41.4 % (ref 36.0–46.0)
HEMOGLOBIN: 14.1 g/dL (ref 12.0–15.0)
MCH: 28.1 pg (ref 26.0–34.0)
MCHC: 34.1 g/dL (ref 30.0–36.0)
MCV: 82.6 fL (ref 78.0–100.0)
Platelets: 338 10*3/uL (ref 150–400)
RBC: 5.01 MIL/uL (ref 3.87–5.11)
RDW: 13.3 % (ref 11.5–15.5)
WBC: 7.2 10*3/uL (ref 4.0–10.5)

## 2014-07-01 LAB — HCG, SERUM, QUALITATIVE: Preg, Serum: NEGATIVE

## 2014-07-01 MED ORDER — CEFAZOLIN SODIUM-DEXTROSE 2-3 GM-% IV SOLR
2.0000 g | INTRAVENOUS | Status: AC
  Administered 2014-07-02: 2 g via INTRAVENOUS
  Filled 2014-07-01: qty 50

## 2014-07-01 NOTE — Pre-Procedure Instructions (Signed)
Andrea GimenezKara Park  07/01/2014   Your procedure is scheduled on:  Tuesday July 02, 2014 at 10:28 AM.  [facility] at 8:25 AM.  Call this number if you have problems the morning of surgery: [phone]   Remember:   Do not eat food or drink liquids after midnight.   Take these medicines the morning of surgery with A SIP OF WATER: Cetirizine (Zyrtec), Dexilant   Do not wear jewelry, make-up or nail polish.  Do not wear lotions, powders, or perfumes.   Do not shave 48 hours prior to surgery.   Do not bring valuables to the hospital.  [facility] is not responsible for any belongings or valuables.               Contacts, dentures or bridgework may not be worn into surgery.  Leave suitcase in the car. After surgery it may be brought to your room.  For patients admitted to the hospital, discharge time is determined by your treatment team.               Patients discharged the day of surgery will not be allowed to drive home.  Name and phone number of your driver:   Special Instructions: Shower using CHG soap the night before and the morning of your surgery   Please read over the following fact sheets that you were given: Pain Booklet, Coughing and Deep Breathing and Surgical Site Infection Prevention

## 2014-07-01 NOTE — H&P (Signed)
[name] 06/07/2014 9:13 AM Location: [facility] Patient #: [id number] DOB: Apr 24, 1990 Single / Language: [place] / Race: White Female  History of Present Illness Andrea Lam A. Magnus Ivan MD; 06/07/2014 9:36 AM) Patient words: chole.  The patient is a 24 year old female who presents for evaluation of gall stones. This is a pleasant 24 year old female who was referred by Dr. Hassan Rowan for evaluation of biliary dyskinesia and gallbladder sludge. The patient has been actually having abdominal symptoms since she was 24 years old. She carries a diagnosis of celiac disease which was confirmed on biopsy. Most recently, she's been having right upper quadrant abdominal pain with nausea after fatty meals. She describes the pain is intermittent and sharp with moderate severity. She has intermittent constipation and diarrhea. She denies jaundice. She has a past history of having had previous upper endoscopy, multiple ultrasounds, and upper GI small bowel follow-through. She is otherwise without complaints   Other Problems Michel Bickers, LPN; 0/45/4098 1:19 AM) Anxiety Disorder Gastric Ulcer Gastroesophageal Reflux Disease Hemorrhoids Ventral Hernia Repair  Past Surgical History Michel Bickers, LPN; 1/47/8295 6:21 AM) Knee Surgery Right. Oral Surgery Ventral / Umbilical Hernia Surgery Bilateral.  Diagnostic Studies History Michel Bickers, LPN; 06/17/6576 4:69 AM) Colonoscopy never Mammogram never Pap Smear never  Allergies Michel Bickers, LPN; 10/09/5282 1:32 AM) Codeine Phosphate *ANALGESICS - OPIOID* Nausea, Vomiting. Nickel Rash.  Medication History Michel Bickers, LPN; 4/40/1027 2:53 AM) Citalopram Hydrobromide (20MG  Tablet, Oral) Active. Dexilant (60MG  Capsule DR, Oral) Active. Junel FE 1.5/30 (1.5-30MG -MCG Tablet, Oral) Active. ZyrTEC Allergy (10MG  Capsule, Oral) Active. PriLOSEC OTC (20MG  Tablet DR, Oral) Active. Medications Reconciled  Social  History Michel Bickers, LPN; 6/64/4034 7:42 AM) Alcohol use Occasional alcohol use. Caffeine use Carbonated beverages, Coffee, Tea. No drug use Tobacco use Never smoker.  Family History Michel Bickers, LPN; 5/95/6387 5:64 AM) Hypertension Father. Melanoma Mother.  Pregnancy / Birth History Michel Bickers, LPN; 3/32/9518 8:41 AM) Age at menarche 13 years. Contraceptive History Oral contraceptives. Gravida 0 Para 0 Regular periods  Review of Systems Tresa Endo [facility] LPN; 6/60/6301 6:01 AM) General Present- Fatigue and Weight Gain. Not Present- Appetite Loss, Chills, Fever, Night Sweats and Weight Loss. Skin Not Present- Change in Wart/Mole, Dryness, Hives, Jaundice, New Lesions, Non-Healing Wounds, Rash and Ulcer. HEENT Present- Seasonal Allergies and Wears glasses/contact lenses. Not Present- Earache, Hearing Loss, Hoarseness, Nose Bleed, Oral Ulcers, Ringing in the Ears, Sinus Pain, Sore Throat, Visual Disturbances and Yellow Eyes. Cardiovascular Not Present- Chest Pain, Difficulty Breathing Lying Down, Leg Cramps, Palpitations, Rapid Heart Rate, Shortness of Breath and Swelling of Extremities. Gastrointestinal Present- Abdominal Pain, Bloating, Constipation, Excessive gas and Indigestion. Not Present- Bloody Stool, Change in Bowel Habits, Chronic diarrhea, Difficulty Swallowing, Gets full quickly at meals, Hemorrhoids, Nausea, Rectal Pain and Vomiting. Female Genitourinary Not Present- Frequency, Nocturia, Painful Urination, Pelvic Pain and Urgency. Musculoskeletal Present- Joint Pain. Not Present- Back Pain, Joint Stiffness, Muscle Pain, Muscle Weakness and Swelling of Extremities. Neurological Not Present- Decreased Memory, Fainting, Headaches, Numbness, Seizures, Tingling, Tremor, Trouble walking and Weakness. Psychiatric Present- Anxiety and Change in Sleep Pattern. Not Present- Bipolar, Depression, Fearful and Frequent crying. Endocrine Present- Cold Intolerance and  Excessive Hunger. Not Present- Hair Changes, Heat Intolerance, Hot flashes and New Diabetes. Hematology Not Present- Easy Bruising, Excessive bleeding, Gland problems, HIV and Persistent Infections.   Vitals Tresa Endo [place] LPN; 0/93/2355 7:32 AM) 06/07/2014 9:17 AM Weight: 167.38 lb Height: 65in Body Surface Area: 1.87 m Body Mass Index: 27.85 kg/m Temp.: 98.52F  Pulse: 68 (Regular)  BP: 116/74 (Sitting, Left Arm, Standard)    Physical Exam (Rilyn Upshaw A. Magnus Ivan MD; 06/07/2014 9:36 AM) General Mental Status-Alert. General Appearance-Consistent with stated age. Hydration-Well hydrated. Voice-Normal.  Head and Neck Head-normocephalic, atraumatic with no lesions or palpable masses.  Eye Eyeball - Bilateral-Extraocular movements intact. Sclera/Conjunctiva - Bilateral-No scleral icterus.  Chest and Lung Exam Chest and lung exam reveals -quiet, even and easy respiratory effort with no use of accessory muscles and on auscultation, normal breath sounds, no adventitious sounds and normal vocal resonance. Inspection Chest Wall - Normal. Back - normal.  Cardiovascular Cardiovascular examination reveals -on palpation PMI is normal in location and amplitude, no palpable S3 or S4. Normal cardiac borders., normal heart sounds, regular rate and rhythm with no murmurs, carotid auscultation reveals no bruits and normal pedal pulses bilaterally.  Abdomen Inspection Inspection of the abdomen reveals - No Hernias. Skin - Scar - no surgical scars. Palpation/Percussion Palpation and Percussion of the abdomen reveal - Soft, Non Tender, No Rebound tenderness, No Rigidity (guarding) and No hepatosplenomegaly. Auscultation Auscultation of the abdomen reveals - Bowel sounds normal.  Neurologic Neurologic evaluation reveals -alert and oriented x 3 with no impairment of recent or remote memory. Mental Status-Normal.  Musculoskeletal Normal Exam - Left-Upper  Extremity Strength Normal and Lower Extremity Strength Normal. Normal Exam - Right-Upper Extremity Strength Normal, Lower Extremity Weakness.    Assessment & Plan (Brien Lowe A. Magnus Ivan MD; 06/07/2014 9:37 AM) BILIARY DYSKINESIA (575.8  K82.8) Impression: I have reviewed her x-ray data. Her ultrasound shows gallbladder sludge. Her HIDA scan shows a 10% gallbladder ejection fraction. Symptoms were reproduced with CCK administration. I suspect she does have an aspect of chronic cholecystitis as well. I recommend cholecystectomy. I gave him literature regarding laparoscopic cholecystectomy. I discussed the risks which includes but are not limited to bleeding, infection, injury to surround structures, bile duct injury, bile leak, the need to convert to an open procedure, the chance this may not resolve all her symptoms, postoperative recovery, etc. She understands and wishes to proceed. Surgery will be scheduled

## 2014-07-02 ENCOUNTER — Encounter (HOSPITAL_COMMUNITY)
Admission: RE | Disposition: A | Discharge status: Home / Self Care | Payer: Self-pay | Source: Ambulatory Visit | Attending: Surgery

## 2014-07-02 ENCOUNTER — Ambulatory Visit (HOSPITAL_COMMUNITY)
Admission: RE | Admit: 2014-07-02 | Discharge: 2014-07-02 | Disposition: A | Discharge status: Home / Self Care | Payer: BLUE CROSS/BLUE SHIELD | Source: Ambulatory Visit | Attending: Surgery | Admitting: Surgery

## 2014-07-02 ENCOUNTER — Ambulatory Visit (HOSPITAL_COMMUNITY): Payer: BLUE CROSS/BLUE SHIELD | Admitting: Anesthesiology

## 2014-07-02 ENCOUNTER — Encounter (HOSPITAL_COMMUNITY): Payer: Self-pay | Admitting: *Deleted

## 2014-07-02 DIAGNOSIS — F1590 Other stimulant use, unspecified, uncomplicated: Secondary | ICD-10-CM | POA: Insufficient documentation

## 2014-07-02 DIAGNOSIS — F419 Anxiety disorder, unspecified: Secondary | ICD-10-CM | POA: Insufficient documentation

## 2014-07-02 DIAGNOSIS — K219 Gastro-esophageal reflux disease without esophagitis: Secondary | ICD-10-CM | POA: Insufficient documentation

## 2014-07-02 DIAGNOSIS — F1099 Alcohol use, unspecified with unspecified alcohol-induced disorder: Secondary | ICD-10-CM | POA: Insufficient documentation

## 2014-07-02 DIAGNOSIS — Z886 Allergy status to analgesic agent status: Secondary | ICD-10-CM | POA: Insufficient documentation

## 2014-07-02 DIAGNOSIS — K811 Chronic cholecystitis: Secondary | ICD-10-CM | POA: Insufficient documentation

## 2014-07-02 HISTORY — PX: CHOLECYSTECTOMY: SHX55

## 2014-07-02 SURGERY — LAPAROSCOPIC CHOLECYSTECTOMY
Anesthesia: General

## 2014-07-02 MED ORDER — HYDROMORPHONE HCL 1 MG/ML IJ SOLN
INTRAMUSCULAR | Status: AC
  Filled 2014-07-02: qty 1

## 2014-07-02 MED ORDER — ROCURONIUM BROMIDE 50 MG/5ML IV SOLN
INTRAVENOUS | Status: AC
  Filled 2014-07-02: qty 1

## 2014-07-02 MED ORDER — ONDANSETRON HCL 4 MG/2ML IJ SOLN
INTRAMUSCULAR | Status: AC
  Filled 2014-07-02: qty 2

## 2014-07-02 MED ORDER — PROMETHAZINE HCL 25 MG/ML IJ SOLN
6.2500 mg | INTRAMUSCULAR | Status: DC | PRN
  Administered 2014-07-02: 6.25 mg via INTRAVENOUS

## 2014-07-02 MED ORDER — ONDANSETRON HCL 4 MG/2ML IJ SOLN
INTRAMUSCULAR | Status: DC | PRN
  Administered 2014-07-02: 4 mg via INTRAVENOUS

## 2014-07-02 MED ORDER — PROPOFOL 10 MG/ML IV BOLUS
INTRAVENOUS | Status: DC | PRN
  Administered 2014-07-02: 180 mg via INTRAVENOUS

## 2014-07-02 MED ORDER — BUPIVACAINE-EPINEPHRINE 0.25% -1:200000 IJ SOLN
INTRAMUSCULAR | Status: DC | PRN
  Administered 2014-07-02: 30 mL

## 2014-07-02 MED ORDER — KETOROLAC TROMETHAMINE 30 MG/ML IJ SOLN: 30.0000 mg | Freq: Once | INTRAMUSCULAR | Status: AC | PRN

## 2014-07-02 MED ORDER — SODIUM CHLORIDE 0.9 % IR SOLN
Status: DC | PRN
  Administered 2014-07-02: 1000 mL

## 2014-07-02 MED ORDER — TRAMADOL HCL 50 MG PO TABS
ORAL_TABLET | ORAL | Status: AC
  Filled 2014-07-02: qty 1

## 2014-07-02 MED ORDER — DEXAMETHASONE SODIUM PHOSPHATE 4 MG/ML IJ SOLN
INTRAMUSCULAR | Status: AC
  Filled 2014-07-02: qty 2

## 2014-07-02 MED ORDER — DEXAMETHASONE SODIUM PHOSPHATE 4 MG/ML IJ SOLN
INTRAMUSCULAR | Status: DC | PRN
  Administered 2014-07-02: 8 mg via INTRAVENOUS

## 2014-07-02 MED ORDER — FENTANYL CITRATE 0.05 MG/ML IJ SOLN
INTRAMUSCULAR | Status: DC | PRN
  Administered 2014-07-02: 100 ug via INTRAVENOUS

## 2014-07-02 MED ORDER — KETOROLAC TROMETHAMINE 30 MG/ML IJ SOLN
INTRAMUSCULAR | Status: AC
  Filled 2014-07-02: qty 1

## 2014-07-02 MED ORDER — HYDROMORPHONE HCL 1 MG/ML IJ SOLN
0.2500 mg | INTRAMUSCULAR | Status: DC | PRN
  Administered 2014-07-02 (×4): 0.5 mg via INTRAVENOUS

## 2014-07-02 MED ORDER — TRAMADOL HCL 50 MG PO TABS
ORAL_TABLET | ORAL | Status: AC
  Administered 2014-07-02: 500 mg
  Filled 2014-07-02: qty 1

## 2014-07-02 MED ORDER — GLYCOPYRROLATE 0.2 MG/ML IJ SOLN
INTRAMUSCULAR | Status: DC | PRN
  Administered 2014-07-02: 0.4 mg via INTRAVENOUS

## 2014-07-02 MED ORDER — TRAMADOL HCL 50 MG PO TABS: 50.0000 mg | ORAL_TABLET | Freq: Four times a day (QID) | ORAL | Status: AC | PRN

## 2014-07-02 MED ORDER — LACTATED RINGERS IV SOLN
INTRAVENOUS | Status: DC
  Administered 2014-07-02: 10:00:00 via INTRAVENOUS

## 2014-07-02 MED ORDER — SCOPOLAMINE 1 MG/3DAYS TD PT72
MEDICATED_PATCH | TRANSDERMAL | Status: DC | PRN
  Administered 2014-07-02: 1 via TRANSDERMAL

## 2014-07-02 MED ORDER — MIDAZOLAM HCL 2 MG/2ML IJ SOLN
INTRAMUSCULAR | Status: AC
  Filled 2014-07-02: qty 2

## 2014-07-02 MED ORDER — KETOROLAC TROMETHAMINE 30 MG/ML IJ SOLN
30.0000 mg | Freq: Once | INTRAMUSCULAR | Status: AC
  Administered 2014-07-02: 30 mg via INTRAVENOUS

## 2014-07-02 MED ORDER — NEOSTIGMINE METHYLSULFATE 10 MG/10ML IV SOLN
INTRAVENOUS | Status: AC
  Filled 2014-07-02: qty 1

## 2014-07-02 MED ORDER — ROCURONIUM BROMIDE 100 MG/10ML IV SOLN
INTRAVENOUS | Status: DC | PRN
  Administered 2014-07-02: 10 mg via INTRAVENOUS

## 2014-07-02 MED ORDER — MIDAZOLAM HCL 5 MG/5ML IJ SOLN
INTRAMUSCULAR | Status: DC | PRN
  Administered 2014-07-02: 2 mg via INTRAVENOUS

## 2014-07-02 MED ORDER — NEOSTIGMINE METHYLSULFATE 10 MG/10ML IV SOLN
INTRAVENOUS | Status: DC | PRN
  Administered 2014-07-02: 3 mg via INTRAVENOUS

## 2014-07-02 MED ORDER — PROPOFOL 10 MG/ML IV BOLUS
INTRAVENOUS | Status: AC
  Filled 2014-07-02: qty 20

## 2014-07-02 MED ORDER — 0.9 % SODIUM CHLORIDE (POUR BTL) OPTIME
TOPICAL | Status: DC | PRN
  Administered 2014-07-02: 1000 mL

## 2014-07-02 MED ORDER — SCOPOLAMINE 1 MG/3DAYS TD PT72
MEDICATED_PATCH | TRANSDERMAL | Status: AC
  Filled 2014-07-02: qty 1

## 2014-07-02 MED ORDER — PROMETHAZINE HCL 25 MG/ML IJ SOLN
INTRAMUSCULAR | Status: AC
  Administered 2014-07-02: 6.25 mg via INTRAVENOUS
  Filled 2014-07-02: qty 1

## 2014-07-02 MED ORDER — TRAMADOL HCL 50 MG PO TABS
ORAL_TABLET | ORAL | Status: AC
  Administered 2014-07-02: 50 mg
  Filled 2014-07-02: qty 1

## 2014-07-02 MED ORDER — LIDOCAINE HCL (CARDIAC) 20 MG/ML IV SOLN
INTRAVENOUS | Status: DC | PRN
  Administered 2014-07-02: 80 mg via INTRAVENOUS

## 2014-07-02 MED ORDER — GLYCOPYRROLATE 0.2 MG/ML IJ SOLN
INTRAMUSCULAR | Status: AC
  Filled 2014-07-02: qty 2

## 2014-07-02 MED ORDER — FENTANYL CITRATE 0.05 MG/ML IJ SOLN
INTRAMUSCULAR | Status: AC
  Filled 2014-07-02: qty 5

## 2014-07-02 MED ORDER — LIDOCAINE HCL (CARDIAC) 20 MG/ML IV SOLN
INTRAVENOUS | Status: AC
  Filled 2014-07-02: qty 5

## 2014-07-02 MED ORDER — HYDROMORPHONE HCL 1 MG/ML IJ SOLN
INTRAMUSCULAR | Status: AC
  Administered 2014-07-02: 0.5 mg via INTRAVENOUS
  Filled 2014-07-02: qty 1

## 2014-07-02 SURGICAL SUPPLY — 39 items
APPLIER CLIP 5 13 M/L LIGAMAX5 (MISCELLANEOUS) ×3
CANISTER SUCTION 2500CC (MISCELLANEOUS) ×3 IMPLANT
CHLORAPREP W/TINT 26ML (MISCELLANEOUS) ×3 IMPLANT
CLIP APPLIE 5 13 M/L LIGAMAX5 (MISCELLANEOUS) ×1 IMPLANT
COVER SURGICAL LIGHT HANDLE (MISCELLANEOUS) ×3 IMPLANT
DRAPE LAPAROSCOPIC ABDOMINAL (DRAPES) ×3 IMPLANT
ELECT REM PT RETURN 9FT ADLT (ELECTROSURGICAL) ×3
ELECTRODE REM PT RTRN 9FT ADLT (ELECTROSURGICAL) ×1 IMPLANT
GLOVE BIO SURGEON STRL SZ 6.5 (GLOVE) ×4 IMPLANT
GLOVE BIO SURGEON STRL SZ8.5 (GLOVE) ×3 IMPLANT
GLOVE BIO SURGEONS STRL SZ 6.5 (GLOVE) ×2
GLOVE BIOGEL PI IND STRL 6.5 (GLOVE) ×2 IMPLANT
GLOVE BIOGEL PI IND STRL 7.0 (GLOVE) ×1 IMPLANT
GLOVE BIOGEL PI IND STRL 8.5 (GLOVE) ×1 IMPLANT
GLOVE BIOGEL PI INDICATOR 6.5 (GLOVE) ×4
GLOVE BIOGEL PI INDICATOR 7.0 (GLOVE) ×2
GLOVE BIOGEL PI INDICATOR 8.5 (GLOVE) ×2
GLOVE SURG SIGNA 7.5 PF LTX (GLOVE) ×3 IMPLANT
GOWN STRL REUS W/ TWL LRG LVL3 (GOWN DISPOSABLE) ×2 IMPLANT
GOWN STRL REUS W/ TWL XL LVL3 (GOWN DISPOSABLE) ×2 IMPLANT
GOWN STRL REUS W/TWL LRG LVL3 (GOWN DISPOSABLE) ×4
GOWN STRL REUS W/TWL XL LVL3 (GOWN DISPOSABLE) ×4
KIT BASIN OR (CUSTOM PROCEDURE TRAY) ×3 IMPLANT
KIT ROOM TURNOVER OR (KITS) ×3 IMPLANT
LIQUID BAND (GAUZE/BANDAGES/DRESSINGS) ×12 IMPLANT
NS IRRIG 1000ML POUR BTL (IV SOLUTION) ×3 IMPLANT
PAD ARMBOARD 7.5X6 YLW CONV (MISCELLANEOUS) ×3 IMPLANT
POUCH SPECIMEN RETRIEVAL 10MM (ENDOMECHANICALS) ×3 IMPLANT
SCISSORS LAP 5X35 DISP (ENDOMECHANICALS) ×3 IMPLANT
SET IRRIG TUBING LAPAROSCOPIC (IRRIGATION / IRRIGATOR) ×3 IMPLANT
SLEEVE ENDOPATH XCEL 5M (ENDOMECHANICALS) ×6 IMPLANT
SPECIMEN JAR SMALL (MISCELLANEOUS) ×3 IMPLANT
SUT MON AB 4-0 PC3 18 (SUTURE) ×6 IMPLANT
TOWEL OR 17X24 6PK STRL BLUE (TOWEL DISPOSABLE) ×3 IMPLANT
TOWEL OR 17X26 10 PK STRL BLUE (TOWEL DISPOSABLE) ×3 IMPLANT
TRAY LAPAROSCOPIC (CUSTOM PROCEDURE TRAY) ×3 IMPLANT
TROCAR XCEL BLUNT TIP 100MML (ENDOMECHANICALS) ×3 IMPLANT
TROCAR XCEL NON-BLD 5MMX100MML (ENDOMECHANICALS) ×3 IMPLANT
TUBING INSUFFLATION (TUBING) ×3 IMPLANT

## 2014-07-02 NOTE — OR Nursing (Signed)
Medical day care called for report.  Pt. Still having 8/10 pain.  Renee stated patient is inappropriate for Medical day care due to IV fentanyl push med for pain.  Renee discussed pt with Procedural Short Stay.  She stated procedural will not take patient with 8/10 pain.

## 2014-07-02 NOTE — Transfer of Care (Signed)
Immediate Anesthesia Transfer of Care Note  Patient: Andrea GimenezKara Park  Procedure(s) Performed: Procedure(s): LAPAROSCOPIC CHOLECYSTECTOMY (N/A)  Patient Location: PACU  Anesthesia Type:General  Level of Consciousness: awake, alert  and oriented  Airway & Oxygen Therapy: Patient Spontanous Breathing and Patient connected to nasal cannula oxygen  Post-op Assessment: Report given to RN and Post -op Vital signs reviewed and stable  Post vital signs: Reviewed and stable  Last Vitals:  Filed Vitals:   07/02/14 0909  BP: 122/69  Pulse: 65  Temp: 36.9 C  Resp: 20    Complications: No apparent anesthesia complications

## 2014-07-02 NOTE — Interval H&P Note (Signed)
History and Physical Interval Note:no change in H and P  07/02/2014 9:21 AM  [name]  has presented today for surgery, with the diagnosis of Cholecysitis  The various methods of treatment have been discussed with the patient and family. After consideration of risks, benefits and other options for treatment, the patient has consented to  Procedure(s): LAPAROSCOPIC CHOLECYSTECTOMY (N/A) as a surgical intervention .  The patient's history has been reviewed, patient examined, no change in status, stable for surgery.  I have reviewed the patient's chart and labs.  Questions were answered to the patient's satisfaction.     [name] A

## 2014-07-02 NOTE — OR Nursing (Signed)
Pt is resting comfortably.  Had to wake patient for pain assessment.  Nausea resolving after phenergan.  Pt states pain is 8/10 in abdomen and falls right asleep when I am not talking to her.  Pt denied need to urinate when awake.

## 2014-07-02 NOTE — Op Note (Signed)

## 2014-07-02 NOTE — OR Nursing (Signed)
Notified Dr. Magnus IvanBlackman of pts current condition and pain of 8/10.  No new orders.  Will arrange Extended stay bed.

## 2014-07-02 NOTE — Anesthesia Postprocedure Evaluation (Signed)
  Anesthesia Post-op Note  Patient: Andrea GimenezKara Journey  Procedure(s) Performed: Procedure(s) (LRB): LAPAROSCOPIC CHOLECYSTECTOMY (N/A)  Patient Location: PACU  Anesthesia Type: General  Level of Consciousness: awake and alert   Airway and Oxygen Therapy: Patient Spontanous Breathing  Post-op Pain: mild  Post-op Assessment: Post-op Vital signs reviewed, Patient's Cardiovascular Status Stable, Respiratory Function Stable, Patent Airway and No signs of Nausea or vomiting  Last Vitals:  Filed Vitals:   07/02/14 1100  BP: 146/81  Pulse: 77  Temp:   Resp: 16    Post-op Vital Signs: stable   Complications: No apparent anesthesia complications

## 2014-07-02 NOTE — Progress Notes (Signed)
Patient continues to have pain but is very sleepy. She quickly falls back to sleep. Patient moved to PACU pediatric room with parents at bedside. Patient states she has pain in her back and on her right side. Patient turned off her back to her right side. Patient tolerated well. Surgical site well approximated with no drainage noted.

## 2014-07-02 NOTE — Discharge Instructions (Signed)
[facility] ______[facility] °LAPAROSCOPIC SURGERY: POST OP INSTRUCTIONS °Always review your discharge instruction sheet given to you by the facility where your surgery was performed. °IF YOU HAVE DISABILITY OR FAMILY LEAVE FORMS, YOU MUST BRING THEM TO THE OFFICE FOR PROCESSING.   °DO NOT GIVE THEM TO YOUR DOCTOR. ° °1. A prescription for pain medication may be given to you upon discharge.  Take your pain medication as prescribed, if needed.  If narcotic pain medicine is not needed, then you may take acetaminophen (Tylenol) or ibuprofen (Advil) as needed. °2. Take your usually prescribed medications unless otherwise directed. °3. If you need a refill on your pain medication, please contact your pharmacy.  They will contact our office to request authorization. Prescriptions will not be filled after 5pm or on week-ends. °4. You should follow a light diet the first few days after arrival home, such as soup and crackers, etc.  Be sure to include lots of fluids daily. °5. Most patients will experience some swelling and bruising in the area of the incisions.  Ice packs will help.  Swelling and bruising can take several days to resolve.  °6. It is common to experience some constipation if taking pain medication after surgery.  Increasing fluid intake and taking a stool softener (such as Colace) will usually help or prevent this problem from occurring.  A mild laxative (Milk of Magnesia or Miralax) should be taken according to package instructions if there are no bowel movements after 48 hours. °7. Unless discharge instructions indicate otherwise, you may remove your bandages 24-48 hours after surgery, and you may shower at that time.  You may have steri-strips (small skin tapes) in place directly over the incision.  These strips should be left on the skin for 7-10 days.  If your surgeon used skin glue on the incision, you may shower in 24 hours.  The glue will flake off over the next 2-3 weeks.  Any sutures or  staples will be removed at the office during your follow-up visit. °8. ACTIVITIES:  You may resume regular (light) daily activities beginning the next day--such as daily self-care, walking, climbing stairs--gradually increasing activities as tolerated.  You may have sexual intercourse when it is comfortable.  Refrain from any heavy lifting or straining until approved by your doctor. °a. You may drive when you are no longer taking prescription pain medication, you can comfortably wear a seatbelt, and you can safely maneuver your car and apply brakes. °b. RETURN TO WORK:  __________________________________________________________ °9. You should see your doctor in the office for a follow-up appointment approximately 2-3 weeks after your surgery.  Make sure that you call for this appointment within a day or two after you arrive home to insure a convenient appointment time. °10. OTHER INSTRUCTIONS: __________________________________________________________________________________________________________________________ __________________________________________________________________________________________________________________________ °WHEN TO CALL YOUR DOCTOR: °1. Fever over 101.0 °2. Inability to urinate °3. Continued bleeding from incision. °4. Increased pain, redness, or drainage from the incision. °5. Increasing abdominal pain ° °The clinic staff is available to answer your questions during regular business hours.  Please don’t hesitate to call and ask to speak to one of the nurses for clinical concerns.  If you have a medical emergency, go to the nearest emergency room or call 911.  A surgeon from [facility] is always on call at the hospital. °[address], [address], [place], [place]  [address] ? [address] [address], [place], [place]   [address] °[phone] ? [phone] ? FAX [phone] °Web site:   [email or web address] °

## 2014-07-02 NOTE — Anesthesia Preprocedure Evaluation (Signed)
Anesthesia Evaluation  Patient identified by MRN, date of birth, ID band Patient awake    Reviewed: Allergy & Precautions, NPO status , Patient's Chart, lab work & pertinent test results  Airway Mallampati: II  TM Distance: >3 FB Neck ROM: Full    Dental no notable dental hx.    Pulmonary neg pulmonary ROS,  breath sounds clear to auscultation  Pulmonary exam normal       Cardiovascular negative cardio ROS  Rhythm:Regular Rate:Normal     Neuro/Psych negative neurological ROS  negative psych ROS   GI/Hepatic negative GI ROS, Neg liver ROS,   Endo/Other  negative endocrine ROS  Renal/GU negative Renal ROS  negative genitourinary   Musculoskeletal negative musculoskeletal ROS (+)   Abdominal   Peds negative pediatric ROS (+)  Hematology negative hematology ROS (+)   Anesthesia Other Findings   Reproductive/Obstetrics negative OB ROS                             Anesthesia Physical Anesthesia Plan  ASA: I  Anesthesia Plan: General   Post-op Pain Management:    Induction: Intravenous  Airway Management Planned: Oral ETT  Additional Equipment:   Intra-op Plan:   Post-operative Plan: Extubation in OR  Informed Consent: I have reviewed the patients History and Physical, chart, labs and discussed the procedure including the risks, benefits and alternatives for the proposed anesthesia with the patient or authorized representative who has indicated his/her understanding and acceptance.   Dental advisory given  Plan Discussed with: CRNA and Surgeon  Anesthesia Plan Comments:         Anesthesia Quick Evaluation  

## 2014-07-02 NOTE — Anesthesia Procedure Notes (Signed)
Procedure Name: Intubation Date/Time: 07/02/2014 9:46 AM Performed by: Quentin OreWALKER, Rawlins Stuard E Pre-anesthesia Checklist: Patient identified, Emergency Drugs available, Suction available, Patient being monitored and Timeout performed Patient Re-evaluated:Patient Re-evaluated prior to inductionOxygen Delivery Method: Circle system utilized Preoxygenation: Pre-oxygenation with 100% oxygen Intubation Type: IV induction Ventilation: Mask ventilation without difficulty Laryngoscope Size: Miller and 2 Grade View: Grade I Tube type: Oral Tube size: 7.0 mm Number of attempts: 1 Airway Equipment and Method: Stylet Placement Confirmation: ETT inserted through vocal cords under direct vision,  positive ETCO2 and breath sounds checked- equal and bilateral Secured at: 22 cm Tube secured with: Tape Dental Injury: Teeth and Oropharynx as per pre-operative assessment

## 2014-07-03 ENCOUNTER — Encounter (HOSPITAL_COMMUNITY): Payer: Self-pay | Admitting: Surgery

## 2014-07-03 NOTE — OR Nursing (Signed)
Pt has remained in Phase II recovery due to intermittent nausea, lethargy due to narcotics and phenergan, and persistent pain 8/10. Due to prior refusal of pt to extended stay areas due to high pain level, pt has remained in the pacu. Around 7pm, pt was awake enough she was willing to ambulate. Ambulation improved pain and moved her pain from her abdomen to her shoulders.  Pt was also able to void and had no change in her incisions.  After multiple ambulations, pt felt her pain was tolerable  and ready for discharge around 2030.  Discharge instructions reviewed with patient and parents.  All questions answered.  Pt. Was discharged to home into the care of her parents.

## 2014-07-05 ENCOUNTER — Telehealth: Payer: Self-pay | Admitting: Surgery

## 2014-07-05 NOTE — Telephone Encounter (Signed)
Ms. Daphine DeutscherMartin had a lap chole for biliary dyskinesia on 3/22. She has Celiac disease and chronic bowel issues.  She sees Dr. Loreta AveMann, but has not seen her in a year.  She has not had a BM since surgery and has some nausea.  She has no fever.  She also does not sound like she has been very active.  I recommended just liquids and MOM if needed. They will check with our office next week if she is still having problems.  DN 07/05/2014.

## 2014-07-08 ENCOUNTER — Encounter (HOSPITAL_COMMUNITY): Payer: Self-pay | Admitting: Surgery

## 2016-04-19 ENCOUNTER — Ambulatory Visit (INDEPENDENT_AMBULATORY_CARE_PROVIDER_SITE_OTHER): Payer: BC Managed Care – PPO | Admitting: Otolaryngology

## 2016-04-19 DIAGNOSIS — J310 Chronic rhinitis: Secondary | ICD-10-CM

## 2016-04-19 DIAGNOSIS — H6983 Other specified disorders of Eustachian tube, bilateral: Secondary | ICD-10-CM

## 2016-04-19 DIAGNOSIS — H93293 Other abnormal auditory perceptions, bilateral: Secondary | ICD-10-CM

## 2016-04-19 DIAGNOSIS — J343 Hypertrophy of nasal turbinates: Secondary | ICD-10-CM

## 2016-04-20 ENCOUNTER — Other Ambulatory Visit (INDEPENDENT_AMBULATORY_CARE_PROVIDER_SITE_OTHER): Payer: Self-pay | Admitting: Otolaryngology

## 2016-04-20 DIAGNOSIS — J320 Chronic maxillary sinusitis: Secondary | ICD-10-CM

## 2016-04-26 ENCOUNTER — Ambulatory Visit (HOSPITAL_COMMUNITY)
Admission: RE | Admit: 2016-04-26 | Discharge: 2016-04-26 | Disposition: A | Discharge status: Home / Self Care | Payer: BC Managed Care – PPO | Source: Ambulatory Visit | Attending: Otolaryngology | Admitting: Otolaryngology

## 2016-04-26 DIAGNOSIS — J320 Chronic maxillary sinusitis: Secondary | ICD-10-CM | POA: Insufficient documentation

## 2016-04-26 DIAGNOSIS — J341 Cyst and mucocele of nose and nasal sinus: Secondary | ICD-10-CM | POA: Diagnosis not present

## 2016-05-15 ENCOUNTER — Encounter (HOSPITAL_COMMUNITY): Payer: Self-pay | Admitting: *Deleted

## 2016-05-15 ENCOUNTER — Ambulatory Visit (HOSPITAL_COMMUNITY)
Admission: EM | Admit: 2016-05-15 | Discharge: 2016-05-15 | Disposition: A | Discharge status: Home / Self Care | Payer: BC Managed Care – PPO | Attending: Family Medicine | Admitting: Family Medicine

## 2016-05-15 DIAGNOSIS — J020 Streptococcal pharyngitis: Secondary | ICD-10-CM | POA: Diagnosis not present

## 2016-05-15 LAB — POCT RAPID STREP A: Streptococcus, Group A Screen (Direct): POSITIVE — AB

## 2016-05-15 MED ORDER — ACETAMINOPHEN 325 MG PO TABS
ORAL_TABLET | ORAL | Status: AC
  Filled 2016-05-15: qty 2

## 2016-05-15 MED ORDER — CEFTRIAXONE SODIUM 1 G IJ SOLR
1.0000 g | Freq: Once | INTRAMUSCULAR | Status: AC
  Administered 2016-05-15: 1 g via INTRAMUSCULAR

## 2016-05-15 MED ORDER — ONDANSETRON 4 MG PO TBDP
4.0000 mg | ORAL_TABLET | Freq: Once | ORAL | Status: AC
  Administered 2016-05-15: 4 mg via ORAL

## 2016-05-15 MED ORDER — ACETAMINOPHEN 325 MG PO TABS
650.0000 mg | ORAL_TABLET | Freq: Once | ORAL | Status: AC
  Administered 2016-05-15: 650 mg via ORAL

## 2016-05-15 MED ORDER — CEFTRIAXONE SODIUM 1 G IJ SOLR
INTRAMUSCULAR | Status: AC
  Filled 2016-05-15: qty 10

## 2016-05-15 MED ORDER — AMOXICILLIN 875 MG PO TABS: 875.0000 mg | ORAL_TABLET | Freq: Two times a day (BID) | ORAL | 0 refills | Status: AC

## 2016-05-15 MED ORDER — ONDANSETRON 4 MG PO TBDP
ORAL_TABLET | ORAL | Status: AC
  Filled 2016-05-15: qty 1

## 2016-05-15 NOTE — ED Triage Notes (Signed)
Pt  Reports      Symptoms  Of      sorethroat       And  Fever      With       Body  Aches   And  Chills

## 2016-05-15 NOTE — ED Provider Notes (Signed)
[facility]    CSN: [id number] Arrival date & time: 05/15/16  1853     History   Chief Complaint Chief Complaint  Patient presents with  . Sore Throat    HPI [name] is a 26 y.o. female.   This a 26 year old woman who presents with sore throat and fever. She woke at 345 this morning with bad sore throat and has vomited several times. She has diffuse myalgias as well.      Past Medical History:  Diagnosis Date  . Anxiety   . Anxiety state, unspecified 09/01/2012  . Complication of anesthesia   . Constipation   . Depression   . Family history of adverse reaction to anesthesia    Takes patients mother a longer time to wake up  . GERD (gastroesophageal reflux disease)   . Gluten enteropathy   . PONV (postoperative nausea and vomiting)   . Seasonal allergies     Patient Active Problem List   Diagnosis Date Noted  . Acne 04/02/2013  . Irregular periods 04/02/2013  . Anxiety state, unspecified 09/01/2012  . Constipation   . Gluten enteropathy     Past Surgical History:  Procedure Laterality Date  . ANTERIOR CRUCIATE LIGAMENT REPAIR Right   . CHOLECYSTECTOMY N/A 07/02/2014   Procedure: LAPAROSCOPIC CHOLECYSTECTOMY;  Surgeon: Abigail Miyamotoouglas Blackman, MD;  [facility];  Service: General;  Laterality: N/A;  . HERNIA REPAIR    . UPPER GI ENDOSCOPY  2014  . WISDOM TOOTH EXTRACTION      OB History    No data available       Home Medications    Prior to Admission medications   Medication Sig Start Date End Date Taking? Authorizing Provider  amoxicillin (AMOXIL) 875 MG tablet Take 1 tablet (875 mg total) by mouth 2 (two) times daily. 05/15/16   Elvina SidleKurt Mazella Deen, MD  cetirizine (ZYRTEC) 10 MG tablet Take 1 tablet (10 mg total) by mouth daily. 04/23/13   Acey LavAllison L Wood, MD  citalopram (CELEXA) 20 MG tablet TAKE 1 TABLET (20 MG TOTAL) BY MOUTH AT BEDTIME AS NEEDED (INSOMNIA).    Laurell Josephsalia A Khalifa, MD  DEXILANT 60 MG capsule Take 60 mg by mouth daily.  08/12/12    Historical Provider, MD  docusate sodium (COLACE) 100 MG capsule Take 100 mg by mouth daily as needed for mild constipation.    Historical Provider, MD  hydrocortisone cream 1 % Apply topically 2 (two) times daily. 09/01/12   Dalia A Bevelyn NgoKhalifa, MD  JUNEL FE 1.5/30 1.5-30 MG-MCG tablet Take 1 tablet by mouth daily.  06/24/12   Historical Provider, MD  Probiotic Product (PROBIOTIC PO) Take 1 tablet by mouth daily.    Historical Provider, MD  traMADol (ULTRAM) 50 MG tablet Take 1-2 tablets (50-100 mg total) by mouth every 6 (six) hours as needed. 07/02/14   Abigail Miyamotoouglas Blackman, MD    Family History History reviewed. No pertinent family history.  Social History Social History  Substance Use Topics  . Smoking status: Never Smoker  . Smokeless tobacco: Not on file  . Alcohol use Yes     Comment: social     Allergies   Codeine; Gluten meal; Hydrocodone; Oxycodone; and Nickel   Review of Systems Review of Systems  Constitutional: Positive for appetite change, chills, diaphoresis and fever.  HENT: Positive for sore throat.   Respiratory: Negative.   Cardiovascular: Negative.   Gastrointestinal: Positive for vomiting.  Musculoskeletal: Positive for myalgias.  Neurological: Positive for dizziness and headaches.  Physical Exam Triage Vital Signs ED Triage Vitals  Enc Vitals Group     BP      Pulse      Resp      Temp      Temp src      SpO2      Weight      Height      Head Circumference      Peak Flow      Pain Score      Pain Loc      Pain Edu?      Excl. in GC?    No data found.   Updated Vital Signs BP 110/70 (BP Location: Right Arm)   Pulse 118   Temp 102.4 F (39.1 C) (Oral)   LMP 05/05/2016    Physical Exam  Constitutional: She appears well-developed and well-nourished.  Nursing note and vitals reviewed.    UC Treatments / Results  Labs (all labs ordered are listed, but only abnormal results are displayed) Labs Reviewed  POCT RAPID STREP A -  Abnormal; Notable for the following:       Result Value   Streptococcus, Group A Screen (Direct) POSITIVE (*)    All other components within normal limits    EKG  EKG Interpretation None       Radiology No results found.  Procedures Procedures (including critical care time)  Medications Ordered in UC Medications  cefTRIAXone (ROCEPHIN) injection 1 g (not administered)  acetaminophen (TYLENOL) tablet 650 mg (not administered)  ondansetron (ZOFRAN-ODT) disintegrating tablet 4 mg (not administered)     Initial Impression / Assessment and Plan / UC Course  I have reviewed the triage vital signs and the nursing notes.  Pertinent labs & imaging results that were available during my care of the patient were reviewed by me and considered in my medical decision making (see chart for details).      Final Clinical Impressions(s) / UC Diagnoses   Final diagnoses:  Strep throat    New Prescriptions New Prescriptions   AMOXICILLIN (AMOXIL) 875 MG TABLET    Take 1 tablet (875 mg total) by mouth 2 (two) times daily.  Given Rocephin, Tylenol, and Zofran in the urgent care setting.   Elvina Sidle, MD 05/15/16 2006

## 2016-08-28 ENCOUNTER — Emergency Department (HOSPITAL_COMMUNITY)
Admission: EM | Admit: 2016-08-28 | Discharge: 2016-08-28 | Disposition: A | Discharge status: Home / Self Care | Payer: BC Managed Care – PPO | Attending: Emergency Medicine | Admitting: Emergency Medicine

## 2016-08-28 ENCOUNTER — Encounter (HOSPITAL_COMMUNITY): Payer: Self-pay | Admitting: Emergency Medicine

## 2016-08-28 DIAGNOSIS — N938 Other specified abnormal uterine and vaginal bleeding: Secondary | ICD-10-CM | POA: Diagnosis not present

## 2016-08-28 DIAGNOSIS — R112 Nausea with vomiting, unspecified: Secondary | ICD-10-CM

## 2016-08-28 DIAGNOSIS — R109 Unspecified abdominal pain: Secondary | ICD-10-CM | POA: Insufficient documentation

## 2016-08-28 DIAGNOSIS — E860 Dehydration: Secondary | ICD-10-CM | POA: Insufficient documentation

## 2016-08-28 LAB — URINALYSIS, ROUTINE W REFLEX MICROSCOPIC
Bacteria, UA: NONE SEEN
Bilirubin Urine: NEGATIVE
GLUCOSE, UA: NEGATIVE mg/dL
Hgb urine dipstick: NEGATIVE
Ketones, ur: 20 mg/dL — AB
LEUKOCYTES UA: NEGATIVE
Nitrite: NEGATIVE
PROTEIN: 30 mg/dL — AB
Specific Gravity, Urine: 1.02 (ref 1.005–1.030)
pH: 8 (ref 5.0–8.0)

## 2016-08-28 LAB — COMPREHENSIVE METABOLIC PANEL
ALK PHOS: 71 U/L (ref 38–126)
ALT: 21 U/L (ref 14–54)
ANION GAP: 10 (ref 5–15)
AST: 25 U/L (ref 15–41)
Albumin: 4.4 g/dL (ref 3.5–5.0)
BUN: 6 mg/dL (ref 6–20)
CALCIUM: 9.6 mg/dL (ref 8.9–10.3)
CHLORIDE: 105 mmol/L (ref 101–111)
CO2: 22 mmol/L (ref 22–32)
Creatinine, Ser: 0.74 mg/dL (ref 0.44–1.00)
GFR calc non Af Amer: >60 mL/min (ref >60)
Glucose, Bld: 111 mg/dL — ABNORMAL HIGH (ref 65–99)
Potassium: 4.1 mmol/L (ref 3.5–5.1)
Sodium: 137 mmol/L (ref 135–145)
Total Bilirubin: 0.7 mg/dL (ref 0.3–1.2)
Total Protein: 7.8 g/dL (ref 6.5–8.1)

## 2016-08-28 LAB — I-STAT BETA HCG BLOOD, ED (MC, WL, AP ONLY): I-stat hCG, quantitative: <5 m[IU]/mL (ref <5)

## 2016-08-28 LAB — CBC
HCT: 40.7 % (ref 36.0–46.0)
Hemoglobin: 13.8 g/dL (ref 12.0–15.0)
MCH: 27.7 pg (ref 26.0–34.0)
MCHC: 33.9 g/dL (ref 30.0–36.0)
MCV: 81.7 fL (ref 78.0–100.0)
Platelets: 315 10*3/uL (ref 150–400)
RBC: 4.98 MIL/uL (ref 3.87–5.11)
RDW: 13 % (ref 11.5–15.5)
WBC: 14.3 10*3/uL — ABNORMAL HIGH (ref 4.0–10.5)

## 2016-08-28 LAB — ETHANOL: Alcohol, Ethyl (B): <5 mg/dL (ref <5)

## 2016-08-28 LAB — LIPASE, BLOOD: LIPASE: 28 U/L (ref 11–51)

## 2016-08-28 MED ORDER — ONDANSETRON HCL 4 MG/2ML IJ SOLN
4.0000 mg | Freq: Once | INTRAMUSCULAR | Status: AC
  Administered 2016-08-28: 4 mg via INTRAVENOUS
  Filled 2016-08-28: qty 2

## 2016-08-28 MED ORDER — KETOROLAC TROMETHAMINE 30 MG/ML IJ SOLN
30.0000 mg | Freq: Once | INTRAMUSCULAR | Status: AC
  Administered 2016-08-28: 30 mg via INTRAVENOUS
  Filled 2016-08-28: qty 1

## 2016-08-28 MED ORDER — ONDANSETRON 4 MG PO TBDP: 4.0000 mg | ORAL_TABLET | Freq: Once | ORAL | Status: DC | PRN

## 2016-08-28 MED ORDER — ONDANSETRON 4 MG PO TBDP
ORAL_TABLET | ORAL | Status: AC
  Administered 2016-08-28: 4 mg
  Filled 2016-08-28: qty 1

## 2016-08-28 MED ORDER — ONDANSETRON 4 MG PO TBDP: 4.0000 mg | ORAL_TABLET | Freq: Three times a day (TID) | ORAL | 0 refills | Status: AC | PRN

## 2016-08-28 MED ORDER — PANTOPRAZOLE SODIUM 40 MG IV SOLR
40.0000 mg | Freq: Once | INTRAVENOUS | Status: AC
  Administered 2016-08-28: 40 mg via INTRAVENOUS
  Filled 2016-08-28: qty 40

## 2016-08-28 MED ORDER — SODIUM CHLORIDE 0.9 % IV BOLUS (SEPSIS)
1000.0000 mL | Freq: Once | INTRAVENOUS | Status: AC
  Administered 2016-08-28: 1000 mL via INTRAVENOUS

## 2016-08-28 NOTE — ED Notes (Signed)
Pt tolerated coke, no c/o headache or nausea.

## 2016-08-28 NOTE — Discharge Instructions (Signed)
Take Zofran for nausea Take Ibuprofen for pain Drink plenty of fluids Return for worsening symptoms

## 2016-08-28 NOTE — ED Provider Notes (Signed)
[facility]   CSN: [id number] Arrival date & time: 08/28/16  1009     History   Chief Complaint Chief Complaint  Patient presents with  . Emesis    HPI [name] is a 26 y.o. female who presents with nausea and vomiting. Past medical history significant for celiac disease. She is status post cholecystectomy. She felt in her usual state of health yesterday. She went out last night and drank heavily. She started to feel nauseous when she got home. She went to sleep and about 4 AM she woke up with multiple episodes of vomiting (about 12 times). She started to vomit bright red blood and bile. She reports associated chills, sweats, and epigastric abdominal pain. She notes she has been having vaginal spotting since last week as well which stopped after she restarted her birth control pills however last night she had bleeding like a period despite not missing any pills. Her last menstrual period was a week and a half ago which was normal. She denies fever, chest pain, SOB, diarrhea, constipation, blood in the stool, dysuria, hematuria, vaginal discharge. She takes oral birth control pills.  HPI  Past Medical History:  Diagnosis Date  . Anxiety   . Anxiety state, unspecified 09/01/2012  . Complication of anesthesia   . Constipation   . Depression   . Family history of adverse reaction to anesthesia    Takes patients mother a longer time to wake up  . GERD (gastroesophageal reflux disease)   . Gluten enteropathy   . PONV (postoperative nausea and vomiting)   . Seasonal allergies     Patient Active Problem List   Diagnosis Date Noted  . Acne 04/02/2013  . Irregular periods 04/02/2013  . Anxiety state, unspecified 09/01/2012  . Constipation   . Gluten enteropathy     Past Surgical History:  Procedure Laterality Date  . ANTERIOR CRUCIATE LIGAMENT REPAIR Right   . CHOLECYSTECTOMY N/A 07/02/2014   Procedure: LAPAROSCOPIC CHOLECYSTECTOMY;  Surgeon: Abigail Miyamotoouglas  Blackman, MD;  [facility];  Service: General;  Laterality: N/A;  . HERNIA REPAIR    . UPPER GI ENDOSCOPY  2014  . WISDOM TOOTH EXTRACTION      OB History    No data available       Home Medications    Prior to Admission medications   Medication Sig Start Date End Date Taking? Authorizing Provider  amoxicillin (AMOXIL) 875 MG tablet Take 1 tablet (875 mg total) by mouth 2 (two) times daily. 05/15/16   Elvina SidleLauenstein, Kurt, MD  cetirizine (ZYRTEC) 10 MG tablet Take 1 tablet (10 mg total) by mouth daily. 04/23/13   Acey LavWood, Allison L, MD  citalopram (CELEXA) 20 MG tablet TAKE 1 TABLET (20 MG TOTAL) BY MOUTH AT BEDTIME AS NEEDED (INSOMNIA).    Khalifa, Dalia A, MD  DEXILANT 60 MG capsule Take 60 mg by mouth daily.  08/12/12   [provider]  docusate sodium (COLACE) 100 MG capsule Take 100 mg by mouth daily as needed for mild constipation.    [provider]  hydrocortisone cream 1 % Apply topically 2 (two) times daily. 09/01/12   Khalifa, Phoebe Perchalia A, MD  JUNEL FE 1.5/30 1.5-30 MG-MCG tablet Take 1 tablet by mouth daily.  06/24/12   [provider]  Probiotic Product (PROBIOTIC PO) Take 1 tablet by mouth daily.    [provider]  traMADol (ULTRAM) 50 MG tablet Take 1-2 tablets (50-100 mg total) by mouth every 6 (six) hours  as needed. 07/02/14   Abigail Miyamoto, MD    Family History No family history on file.  Social History Social History  Substance Use Topics  . Smoking status: Never Smoker  . Smokeless tobacco: Never Used  . Alcohol use Yes     Comment: social     Allergies   Codeine; Gluten meal; Hydrocodone; Oxycodone; and Nickel   Review of Systems Review of Systems  Constitutional: Positive for chills and diaphoresis. Negative for fever.  Respiratory: Negative for shortness of breath.   Cardiovascular: Negative for chest pain.  Gastrointestinal: Positive for abdominal pain, nausea and vomiting. Negative for blood in stool, constipation and  diarrhea.       +hematemesis  Genitourinary: Positive for vaginal bleeding. Negative for dysuria, hematuria, pelvic pain and vaginal discharge.  All other systems reviewed and are negative.    Physical Exam Updated Vital Signs BP 119/81   Pulse 89   Temp 98.3 F (36.8 C) (Oral)   Resp 16   Ht 5\' 5"  (1.651 m)   Wt 160 lb (72.6 kg)   LMP 08/14/2016   SpO2 100%   BMI 26.63 kg/m   Physical Exam  Constitutional: She is oriented to person, place, and time. She appears well-developed and well-nourished. No distress.  HENT:  Head: Normocephalic and atraumatic.  Eyes: Conjunctivae are normal. Pupils are equal, round, and reactive to light. Right eye exhibits no discharge. Left eye exhibits no discharge. No scleral icterus.  Neck: Normal range of motion.  Cardiovascular: Normal rate and regular rhythm.  Exam reveals no gallop and no friction rub.   No murmur heard. Pulmonary/Chest: Effort normal and breath sounds normal. No respiratory distress. She has no wheezes. She has no rales. She exhibits no tenderness.  Abdominal: Soft. Bowel sounds are normal. She exhibits no distension and no mass. There is tenderness (generalized tenderness. Pt points to epigastric area). There is no rebound and no guarding. No hernia.  Neurological: She is alert and oriented to person, place, and time.  Skin: Skin is warm and dry.  Psychiatric: She has a normal mood and affect. Her behavior is normal.  Nursing note and vitals reviewed.    ED Treatments / Results  Labs (all labs ordered are listed, but only abnormal results are displayed) Labs Reviewed  COMPREHENSIVE METABOLIC PANEL - Abnormal; Notable for the following:       Result Value   Glucose, Bld 111 (*)    All other components within normal limits  CBC - Abnormal; Notable for the following:    WBC 14.3 (*)    All other components within normal limits  URINALYSIS, ROUTINE W REFLEX MICROSCOPIC - Abnormal; Notable for the following:     Ketones, ur 20 (*)    Protein, ur 30 (*)    Squamous Epithelial / LPF 0-5 (*)    All other components within normal limits  LIPASE, BLOOD  ETHANOL  I-STAT BETA HCG BLOOD, ED ([facility], [facility], [facility] ONLY)    EKG  EKG Interpretation None       Radiology No results found.  Procedures Procedures (including critical care time)  Medications Ordered in ED Medications  ondansetron (ZOFRAN-ODT) disintegrating tablet 4 mg (not administered)  ondansetron (ZOFRAN-ODT) 4 MG disintegrating tablet (4 mg  Given 08/28/16 1038)  sodium chloride 0.9 % bolus 1,000 mL (1,000 mLs Intravenous New Bag/Given 08/28/16 1251)  ondansetron (ZOFRAN) injection 4 mg (4 mg Intravenous Given 08/28/16 1252)  pantoprazole (PROTONIX) injection 40 mg (40 mg Intravenous Given 08/28/16  1252)  ketorolac (TORADOL) 30 MG/ML injection 30 mg (30 mg Intravenous Given 08/28/16 1458)     Initial Impression / Assessment and Plan / ED Course  I have reviewed the triage vital signs and the nursing notes.  Pertinent labs & imaging results that were available during my care of the patient were reviewed by me and considered in my medical decision making (see chart for details).  55:47 PM  26 year old female with N/V and epigastric abdominal pain after heavy ETOH use. She is mildly tachycardic and hypertensive on arrival. Otherwise vitals are normal. CBC remarkable for leukocytosis of 14.3. CMP and lipase are normal. UA remarkable for 20 ketones, 30 protein. Preg test is negative. Will give fluids, zofran, and protonix and reassess.  3:33 PM patient tolerated PO challenge. Toradol given for pain. Will d/c with Zofran. Return precautions given.   Final Clinical Impressions(s) / ED Diagnoses   Final diagnoses:  Non-intractable vomiting with nausea, unspecified vomiting type  Dehydration    New Prescriptions New Prescriptions   No medications on file     Bethel Born, PA-C 08/28/16 1534    Rolan Bucco, MD 08/28/16  [phone]

## 2016-08-28 NOTE — ED Triage Notes (Signed)
Pt c/o vomiting "Bile" -- started at 4am -- after drinking etoh last night- states had something similar and was admitted to a hospital for 2 days in the past. Pt having continuous vomiting in triage room.

## 2017-01-28 ENCOUNTER — Encounter (HOSPITAL_COMMUNITY): Payer: Self-pay

## 2017-01-28 ENCOUNTER — Other Ambulatory Visit (HOSPITAL_COMMUNITY): Payer: Self-pay | Admitting: Family Medicine

## 2017-01-28 ENCOUNTER — Ambulatory Visit (HOSPITAL_COMMUNITY)
Admission: RE | Admit: 2017-01-28 | Discharge: 2017-01-28 | Disposition: A | Discharge status: Home / Self Care | Payer: BC Managed Care – PPO | Source: Ambulatory Visit | Attending: Family Medicine | Admitting: Family Medicine

## 2017-01-28 DIAGNOSIS — N83202 Unspecified ovarian cyst, left side: Secondary | ICD-10-CM | POA: Diagnosis not present

## 2017-01-28 DIAGNOSIS — R1031 Right lower quadrant pain: Secondary | ICD-10-CM | POA: Diagnosis present

## 2017-01-28 DIAGNOSIS — Z9889 Other specified postprocedural states: Secondary | ICD-10-CM | POA: Diagnosis not present

## 2017-01-28 DIAGNOSIS — Z9049 Acquired absence of other specified parts of digestive tract: Secondary | ICD-10-CM | POA: Insufficient documentation

## 2017-01-28 DIAGNOSIS — N83201 Unspecified ovarian cyst, right side: Secondary | ICD-10-CM | POA: Diagnosis not present

## 2017-01-28 MED ORDER — IOPAMIDOL (ISOVUE-300) INJECTION 61%
INTRAVENOUS | Status: AC
  Filled 2017-01-28: qty 30

## 2017-01-28 MED ORDER — IOPAMIDOL (ISOVUE-300) INJECTION 61%
100.0000 mL | Freq: Once | INTRAVENOUS | Status: AC | PRN
  Administered 2017-01-28: 100 mL via INTRAVENOUS

## 2018-09-04 IMAGING — CT CT ABD-PELV W/ CM
2 of 4 series · 16 of 46 positions shown, 18 images · IV contrast (Isovue)
Comparison: 04/16/2014 right upper quadrant ultrasound. No
comparison CT.

ADDENDUM:
Elongated liver spanning over 21 cm.  Prior cholecystectomy.
CLINICAL DATA: 26-year-old female with right lower quadrant pain
for 3 days. Post cholecystectomy. Hernia repair. Initial encounter.

EXAM:
CT ABDOMEN AND PELVIS WITH CONTRAST
TECHNIQUE: Multidetector CT imaging of the abdomen and pelvis was performed
using the standard protocol following bolus administration of
intravenous contrast.
CONTRAST:  100mL [id number] IOPAMIDOL ([id number]-WFF) INJECTION 61%

[Series 2: axial st · axial · 0.85mm/px · z∈[+1039,+1479]mm · 13 of 98 slices shown, 15 images]
[im 5/98  soft-tissue]
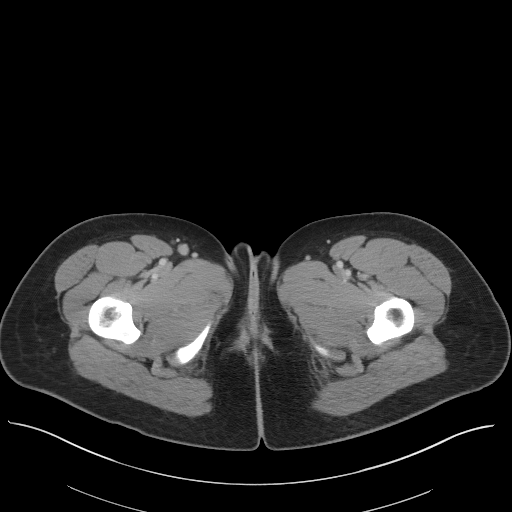
[im 5/98  bone]
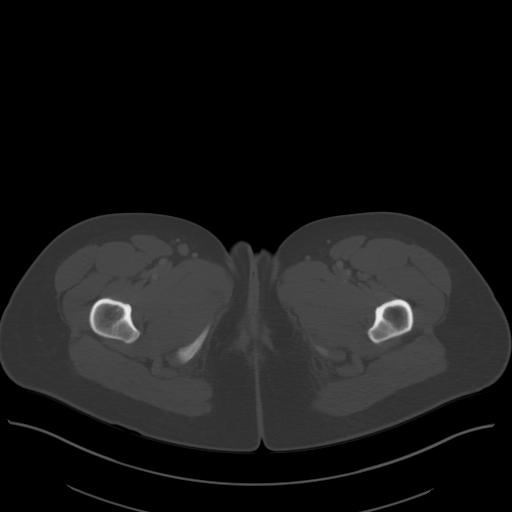
[im 13/98  soft-tissue]
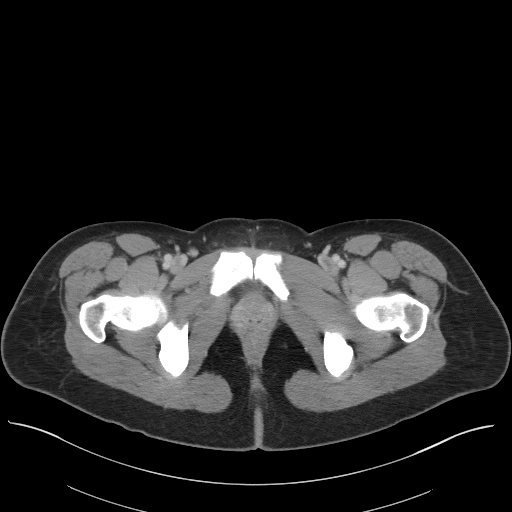
[im 21/98  soft-tissue]
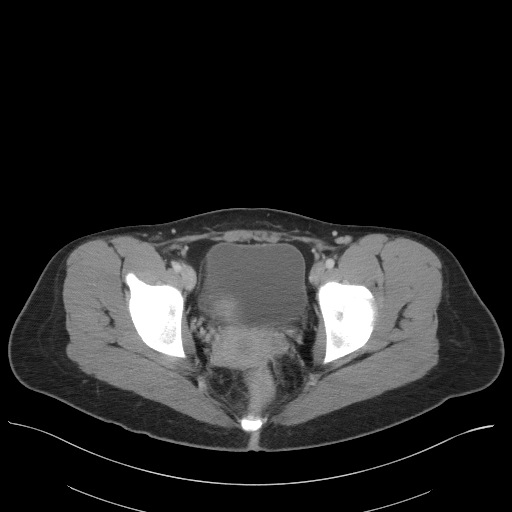
[im 29/98  soft-tissue]
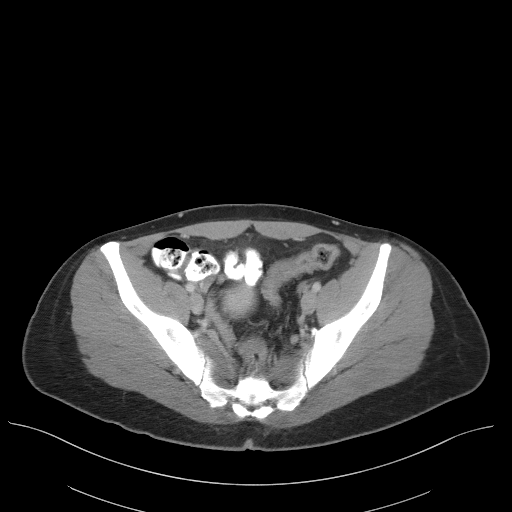
[im 33/98  soft-tissue]
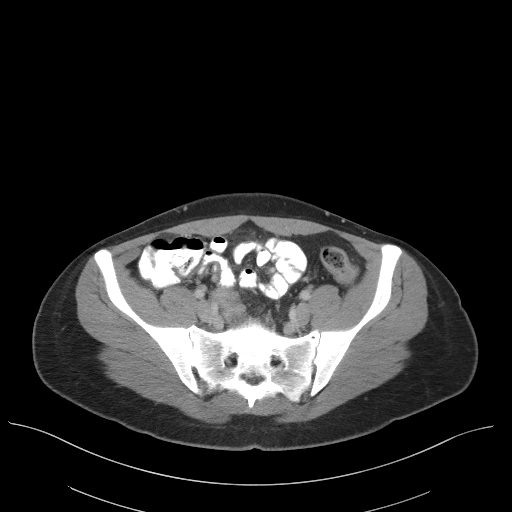
[im 41/98  soft-tissue]
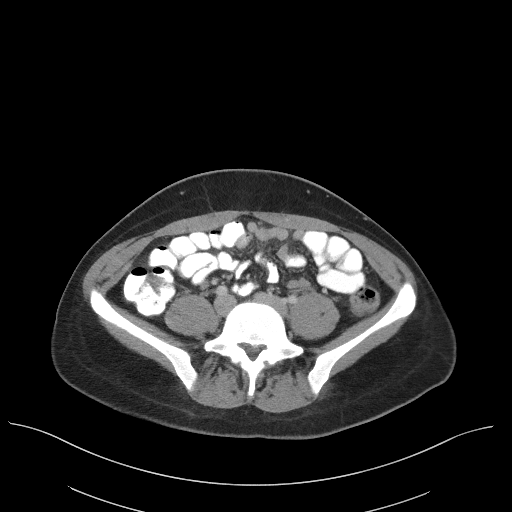
[im 49/98  soft-tissue]
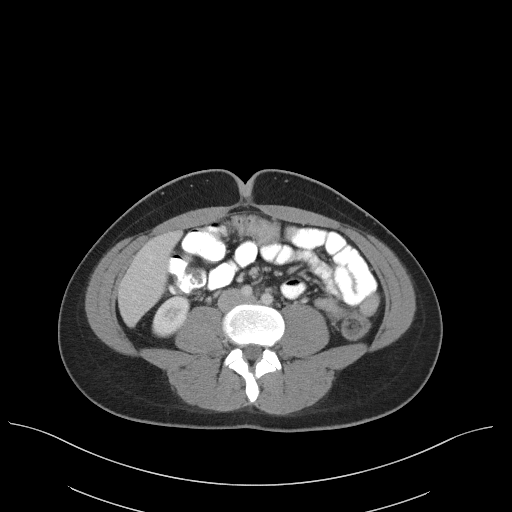
[im 57/98  soft-tissue]
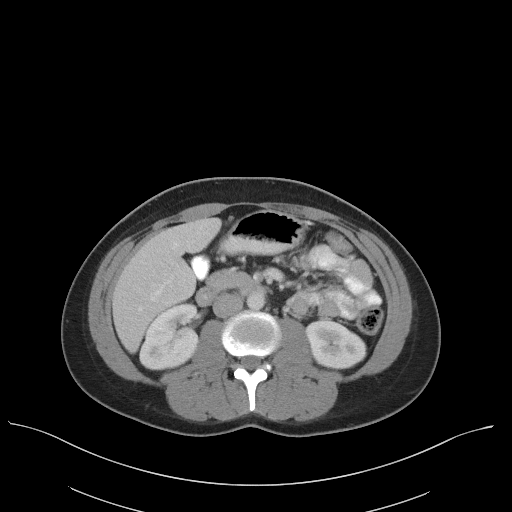
[im 65/98  soft-tissue]
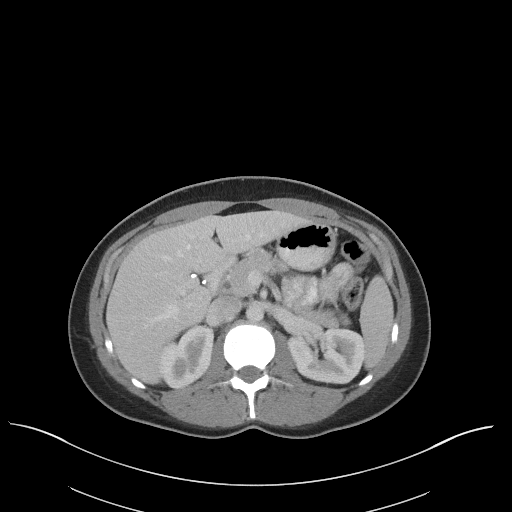
[im 65/98  bone]
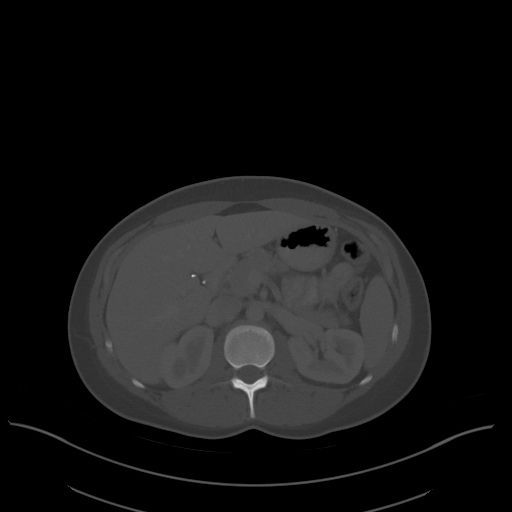
[im 69/98  soft-tissue]
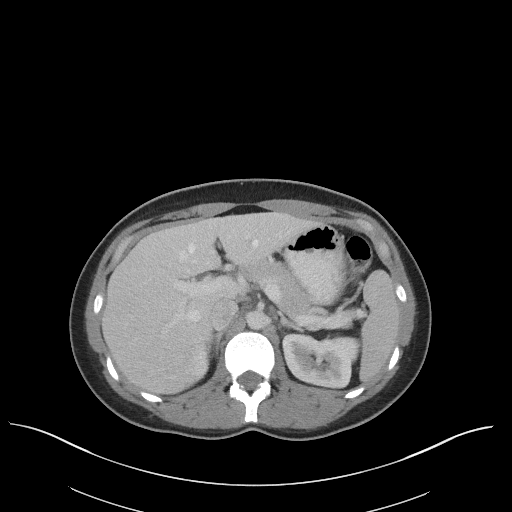
[im 77/98  soft-tissue]
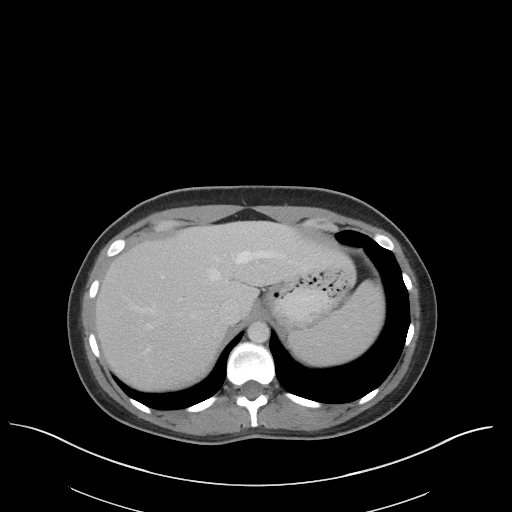
[im 85/98  soft-tissue]
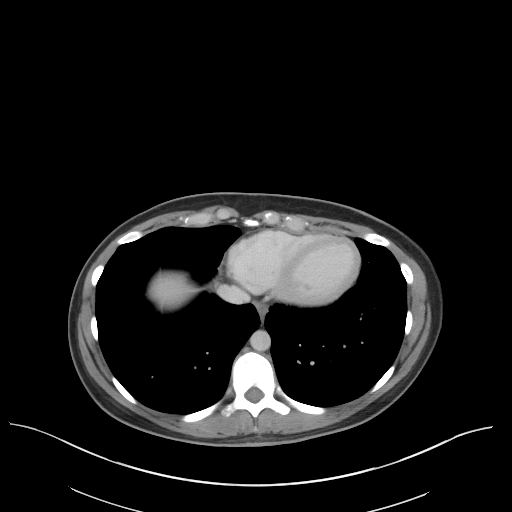
[im 93/98  soft-tissue]
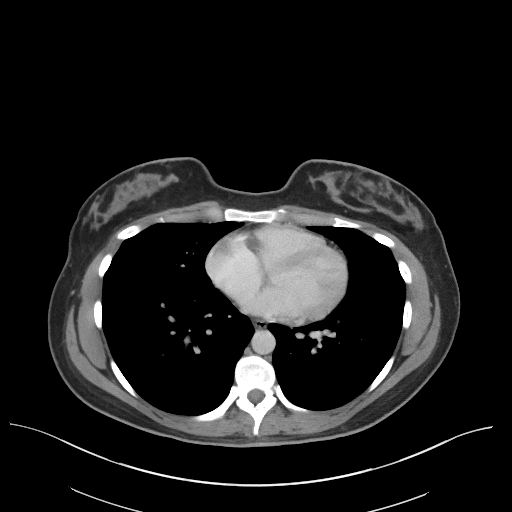

[Series 5: coronal st · coronal · 0.85mm/px · 3 of 73 slices shown]
[im 25/73  soft-tissue]
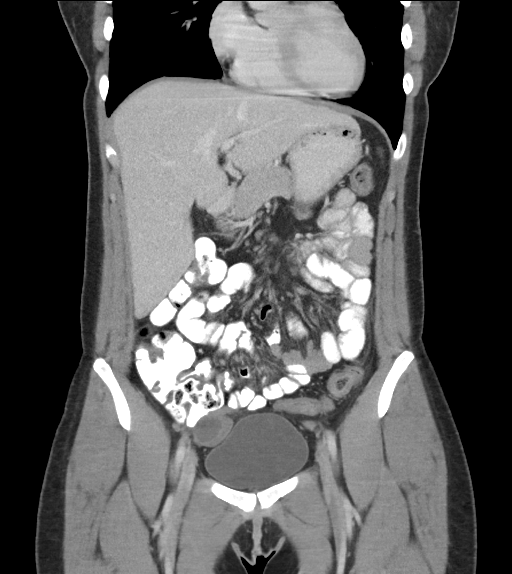
[im 33/73  soft-tissue]
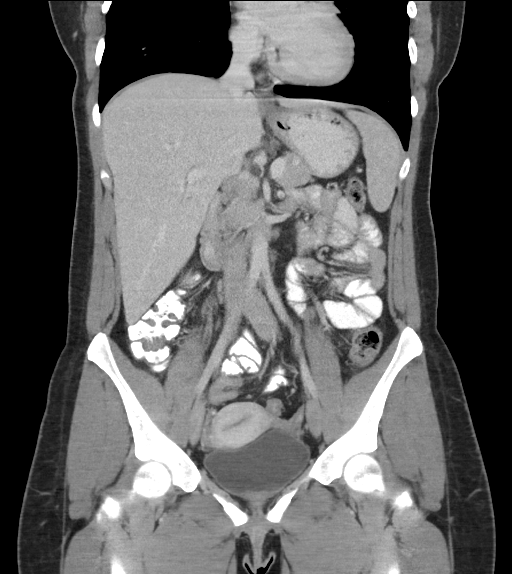
[im 41/73  soft-tissue]
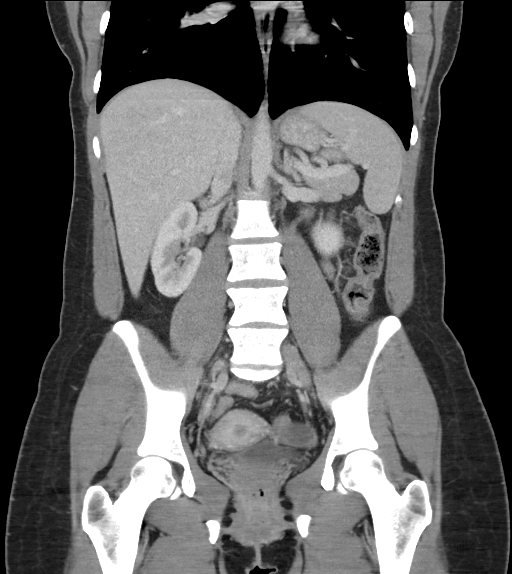

[16 of 46 positions shown; findings below may reference images not displayed]

FINDINGS: Lower chest: Negative.

Hepatobiliary: Elongated liver spanning over 21 cm. No focal
worrisome hepatic lesion.

Post cholecystectomy.  No calcified common bile duct stone.

Pancreas: No pancreatic mass or inflammation.

Spleen: No splenic mass or enlargement.

Adrenals/Urinary Tract: No obstructing stone or hydronephrosis. No
renal or adrenal mass. Noncontrast filled views of the urinary
bladder unremarkable.

Stomach/Bowel: The appendix is partially contrast and gas-filled
without evidence of inflammation.

Long segment of colon from the proximal transverse colon to the
rectum is under distended. Additionally, mild enhancement of the
under distended rectosigmoid colon. It is possible this represents
an incidental finding and is simply be related to under distension.
Mild colitis could not be excluded in proper clinical setting.

No primary small bowel or gastric abnormality noted.

Vascular/Lymphatic: No aortic aneurysm or large vessel occlusion.

Scattered small lymph nodes without adenopathy.

Reproductive: Bilateral ovarian cysts with septated right ovarian
cyst spanning over 3.4 cm and left ovarian cyst measuring up to
cm.

Other: Small amount of free fluid in pelvis may be related to recent
ovulation. Results of colitis a secondary consideration.

No free intraperitoneal air.

Musculoskeletal: Mild degenerative changes L4-5 and L5-S1.
IMPRESSION: There is a small amount of free fluid in pelvis. It is possible this
is related to ovulation. Small ovarian cysts noted bilaterally.

Appendix is contrast and gas-filled and does not appear to be
inflamed.

Long segment of colon from the proximal transverse colon to the
rectum is under distended. Additionally, mild enhancement of the
under distended rectosigmoid colon. It is possible this represents
an incidental finding and is simply be related to under distension.
Mild colitis could not be excluded in proper clinical setting.

These results will be called to the ordering clinician or
representative by the Radiologist Assistant, and communication
documented in the PACS or zVision Dashboard.

## 2019-07-31 DIAGNOSIS — Z6826 Body mass index (BMI) 26.0-26.9, adult: Secondary | ICD-10-CM | POA: Diagnosis not present

## 2019-07-31 DIAGNOSIS — N898 Other specified noninflammatory disorders of vagina: Secondary | ICD-10-CM | POA: Diagnosis not present

## 2019-07-31 DIAGNOSIS — Z01411 Encounter for gynecological examination (general) (routine) with abnormal findings: Secondary | ICD-10-CM | POA: Diagnosis not present

## 2019-10-08 DIAGNOSIS — F419 Anxiety disorder, unspecified: Secondary | ICD-10-CM | POA: Diagnosis not present

## 2019-10-08 DIAGNOSIS — R82998 Other abnormal findings in urine: Secondary | ICD-10-CM | POA: Diagnosis not present

## 2019-10-08 DIAGNOSIS — R110 Nausea: Secondary | ICD-10-CM | POA: Diagnosis not present

## 2019-10-08 DIAGNOSIS — G43909 Migraine, unspecified, not intractable, without status migrainosus: Secondary | ICD-10-CM | POA: Diagnosis not present

## 2019-10-08 DIAGNOSIS — K900 Celiac disease: Secondary | ICD-10-CM | POA: Diagnosis not present

## 2019-11-26 DIAGNOSIS — R6889 Other general symptoms and signs: Secondary | ICD-10-CM | POA: Diagnosis not present

## 2019-11-26 DIAGNOSIS — K900 Celiac disease: Secondary | ICD-10-CM | POA: Diagnosis not present

## 2019-11-26 DIAGNOSIS — R21 Rash and other nonspecific skin eruption: Secondary | ICD-10-CM | POA: Diagnosis not present

## 2019-11-26 DIAGNOSIS — F329 Major depressive disorder, single episode, unspecified: Secondary | ICD-10-CM | POA: Diagnosis not present

## 2019-11-26 DIAGNOSIS — R197 Diarrhea, unspecified: Secondary | ICD-10-CM | POA: Diagnosis not present

## 2019-11-26 DIAGNOSIS — F419 Anxiety disorder, unspecified: Secondary | ICD-10-CM | POA: Diagnosis not present

## 2019-11-27 DIAGNOSIS — R197 Diarrhea, unspecified: Secondary | ICD-10-CM | POA: Diagnosis not present

## 2020-01-08 DIAGNOSIS — M62830 Muscle spasm of back: Secondary | ICD-10-CM | POA: Diagnosis not present

## 2020-01-08 DIAGNOSIS — M5416 Radiculopathy, lumbar region: Secondary | ICD-10-CM | POA: Diagnosis not present

## 2020-01-08 DIAGNOSIS — N390 Urinary tract infection, site not specified: Secondary | ICD-10-CM | POA: Diagnosis not present

## 2020-01-08 DIAGNOSIS — M545 Low back pain: Secondary | ICD-10-CM | POA: Diagnosis not present

## 2020-01-16 DIAGNOSIS — A045 Campylobacter enteritis: Secondary | ICD-10-CM | POA: Diagnosis not present

## 2020-01-16 DIAGNOSIS — K900 Celiac disease: Secondary | ICD-10-CM | POA: Diagnosis not present

## 2020-01-16 DIAGNOSIS — K5900 Constipation, unspecified: Secondary | ICD-10-CM | POA: Diagnosis not present

## 2020-01-16 DIAGNOSIS — N3000 Acute cystitis without hematuria: Secondary | ICD-10-CM | POA: Diagnosis not present

## 2020-06-06 DIAGNOSIS — D224 Melanocytic nevi of scalp and neck: Secondary | ICD-10-CM | POA: Diagnosis not present

## 2020-06-06 DIAGNOSIS — D2239 Melanocytic nevi of other parts of face: Secondary | ICD-10-CM | POA: Diagnosis not present

## 2020-06-06 DIAGNOSIS — B360 Pityriasis versicolor: Secondary | ICD-10-CM | POA: Diagnosis not present

## 2020-06-17 DIAGNOSIS — Z23 Encounter for immunization: Secondary | ICD-10-CM | POA: Diagnosis not present

## 2020-06-27 DIAGNOSIS — Z6830 Body mass index (BMI) 30.0-30.9, adult: Secondary | ICD-10-CM | POA: Diagnosis not present

## 2020-06-27 DIAGNOSIS — Z0001 Encounter for general adult medical examination with abnormal findings: Secondary | ICD-10-CM | POA: Diagnosis not present

## 2020-06-27 DIAGNOSIS — K900 Celiac disease: Secondary | ICD-10-CM | POA: Diagnosis not present

## 2020-06-27 DIAGNOSIS — Z1389 Encounter for screening for other disorder: Secondary | ICD-10-CM | POA: Diagnosis not present

## 2020-06-27 DIAGNOSIS — F329 Major depressive disorder, single episode, unspecified: Secondary | ICD-10-CM | POA: Diagnosis not present

## 2020-06-27 DIAGNOSIS — E6609 Other obesity due to excess calories: Secondary | ICD-10-CM | POA: Diagnosis not present

## 2020-06-27 DIAGNOSIS — L608 Other nail disorders: Secondary | ICD-10-CM | POA: Diagnosis not present

## 2020-07-15 DIAGNOSIS — Z23 Encounter for immunization: Secondary | ICD-10-CM | POA: Diagnosis not present

## 2020-07-17 DIAGNOSIS — H5213 Myopia, bilateral: Secondary | ICD-10-CM | POA: Diagnosis not present

## 2020-07-28 DIAGNOSIS — R0789 Other chest pain: Secondary | ICD-10-CM | POA: Diagnosis not present

## 2020-07-28 DIAGNOSIS — Z20822 Contact with and (suspected) exposure to covid-19: Secondary | ICD-10-CM | POA: Diagnosis not present

## 2020-07-28 DIAGNOSIS — M791 Myalgia, unspecified site: Secondary | ICD-10-CM | POA: Diagnosis not present

## 2020-07-29 DIAGNOSIS — F320 Major depressive disorder, single episode, mild: Secondary | ICD-10-CM | POA: Diagnosis not present

## 2020-08-01 DIAGNOSIS — E6609 Other obesity due to excess calories: Secondary | ICD-10-CM | POA: Diagnosis not present

## 2020-08-01 DIAGNOSIS — F329 Major depressive disorder, single episode, unspecified: Secondary | ICD-10-CM | POA: Diagnosis not present

## 2020-08-01 DIAGNOSIS — Z6831 Body mass index (BMI) 31.0-31.9, adult: Secondary | ICD-10-CM | POA: Diagnosis not present

## 2020-08-12 DIAGNOSIS — F320 Major depressive disorder, single episode, mild: Secondary | ICD-10-CM | POA: Diagnosis not present

## 2020-08-14 DIAGNOSIS — J310 Chronic rhinitis: Secondary | ICD-10-CM | POA: Diagnosis not present

## 2020-08-14 DIAGNOSIS — J343 Hypertrophy of nasal turbinates: Secondary | ICD-10-CM | POA: Diagnosis not present

## 2020-08-14 DIAGNOSIS — R040 Epistaxis: Secondary | ICD-10-CM | POA: Diagnosis not present

## 2020-08-14 DIAGNOSIS — R519 Headache, unspecified: Secondary | ICD-10-CM | POA: Diagnosis not present

## 2020-08-20 DIAGNOSIS — F320 Major depressive disorder, single episode, mild: Secondary | ICD-10-CM | POA: Diagnosis not present

## 2020-09-05 DIAGNOSIS — Z03818 Encounter for observation for suspected exposure to other biological agents ruled out: Secondary | ICD-10-CM | POA: Diagnosis not present

## 2020-11-23 DIAGNOSIS — Z20822 Contact with and (suspected) exposure to covid-19: Secondary | ICD-10-CM | POA: Diagnosis not present

## 2020-11-23 DIAGNOSIS — R350 Frequency of micturition: Secondary | ICD-10-CM | POA: Diagnosis not present

## 2020-11-23 DIAGNOSIS — M791 Myalgia, unspecified site: Secondary | ICD-10-CM | POA: Diagnosis not present

## 2021-03-12 DIAGNOSIS — Z3009 Encounter for other general counseling and advice on contraception: Secondary | ICD-10-CM | POA: Diagnosis not present

## 2021-03-12 DIAGNOSIS — N390 Urinary tract infection, site not specified: Secondary | ICD-10-CM | POA: Diagnosis not present

## 2021-03-12 DIAGNOSIS — E6609 Other obesity due to excess calories: Secondary | ICD-10-CM | POA: Diagnosis not present

## 2021-03-12 DIAGNOSIS — Z6829 Body mass index (BMI) 29.0-29.9, adult: Secondary | ICD-10-CM | POA: Diagnosis not present

## 2021-03-12 DIAGNOSIS — B3731 Acute candidiasis of vulva and vagina: Secondary | ICD-10-CM | POA: Diagnosis not present

## 2021-03-26 DIAGNOSIS — Z6829 Body mass index (BMI) 29.0-29.9, adult: Secondary | ICD-10-CM | POA: Diagnosis not present

## 2021-03-26 DIAGNOSIS — Z113 Encounter for screening for infections with a predominantly sexual mode of transmission: Secondary | ICD-10-CM | POA: Diagnosis not present

## 2021-03-26 DIAGNOSIS — R7303 Prediabetes: Secondary | ICD-10-CM | POA: Diagnosis not present

## 2021-03-26 DIAGNOSIS — Z01419 Encounter for gynecological examination (general) (routine) without abnormal findings: Secondary | ICD-10-CM | POA: Diagnosis not present

## 2021-03-26 DIAGNOSIS — Z1151 Encounter for screening for human papillomavirus (HPV): Secondary | ICD-10-CM | POA: Diagnosis not present

## 2021-03-26 DIAGNOSIS — Z124 Encounter for screening for malignant neoplasm of cervix: Secondary | ICD-10-CM | POA: Diagnosis not present

## 2021-05-21 DIAGNOSIS — S233XXA Sprain of ligaments of thoracic spine, initial encounter: Secondary | ICD-10-CM | POA: Diagnosis not present

## 2021-05-21 DIAGNOSIS — S338XXA Sprain of other parts of lumbar spine and pelvis, initial encounter: Secondary | ICD-10-CM | POA: Diagnosis not present

## 2021-05-21 DIAGNOSIS — G43001 Migraine without aura, not intractable, with status migrainosus: Secondary | ICD-10-CM | POA: Diagnosis not present

## 2021-05-28 DIAGNOSIS — S233XXA Sprain of ligaments of thoracic spine, initial encounter: Secondary | ICD-10-CM | POA: Diagnosis not present

## 2021-05-28 DIAGNOSIS — G43001 Migraine without aura, not intractable, with status migrainosus: Secondary | ICD-10-CM | POA: Diagnosis not present

## 2021-05-28 DIAGNOSIS — S338XXA Sprain of other parts of lumbar spine and pelvis, initial encounter: Secondary | ICD-10-CM | POA: Diagnosis not present

## 2021-06-04 DIAGNOSIS — S233XXA Sprain of ligaments of thoracic spine, initial encounter: Secondary | ICD-10-CM | POA: Diagnosis not present

## 2021-06-04 DIAGNOSIS — S338XXA Sprain of other parts of lumbar spine and pelvis, initial encounter: Secondary | ICD-10-CM | POA: Diagnosis not present

## 2021-06-04 DIAGNOSIS — G43001 Migraine without aura, not intractable, with status migrainosus: Secondary | ICD-10-CM | POA: Diagnosis not present

## 2021-06-11 DIAGNOSIS — K582 Mixed irritable bowel syndrome: Secondary | ICD-10-CM | POA: Diagnosis not present

## 2021-06-11 DIAGNOSIS — R0789 Other chest pain: Secondary | ICD-10-CM | POA: Diagnosis not present

## 2021-06-11 DIAGNOSIS — K900 Celiac disease: Secondary | ICD-10-CM | POA: Diagnosis not present

## 2021-06-11 DIAGNOSIS — R112 Nausea with vomiting, unspecified: Secondary | ICD-10-CM | POA: Diagnosis not present

## 2021-07-02 DIAGNOSIS — S338XXA Sprain of other parts of lumbar spine and pelvis, initial encounter: Secondary | ICD-10-CM | POA: Diagnosis not present

## 2021-07-02 DIAGNOSIS — G43001 Migraine without aura, not intractable, with status migrainosus: Secondary | ICD-10-CM | POA: Diagnosis not present

## 2021-07-02 DIAGNOSIS — S233XXA Sprain of ligaments of thoracic spine, initial encounter: Secondary | ICD-10-CM | POA: Diagnosis not present

## 2021-07-23 DIAGNOSIS — S233XXA Sprain of ligaments of thoracic spine, initial encounter: Secondary | ICD-10-CM | POA: Diagnosis not present

## 2021-07-23 DIAGNOSIS — S338XXA Sprain of other parts of lumbar spine and pelvis, initial encounter: Secondary | ICD-10-CM | POA: Diagnosis not present

## 2021-07-23 DIAGNOSIS — G43001 Migraine without aura, not intractable, with status migrainosus: Secondary | ICD-10-CM | POA: Diagnosis not present

## 2021-08-06 DIAGNOSIS — S233XXA Sprain of ligaments of thoracic spine, initial encounter: Secondary | ICD-10-CM | POA: Diagnosis not present

## 2021-08-06 DIAGNOSIS — G43001 Migraine without aura, not intractable, with status migrainosus: Secondary | ICD-10-CM | POA: Diagnosis not present

## 2021-08-06 DIAGNOSIS — S338XXA Sprain of other parts of lumbar spine and pelvis, initial encounter: Secondary | ICD-10-CM | POA: Diagnosis not present

## 2021-08-20 DIAGNOSIS — M722 Plantar fascial fibromatosis: Secondary | ICD-10-CM | POA: Diagnosis not present

## 2021-08-20 DIAGNOSIS — M79671 Pain in right foot: Secondary | ICD-10-CM | POA: Diagnosis not present

## 2021-08-20 DIAGNOSIS — M25579 Pain in unspecified ankle and joints of unspecified foot: Secondary | ICD-10-CM | POA: Diagnosis not present

## 2021-08-20 DIAGNOSIS — M1990 Unspecified osteoarthritis, unspecified site: Secondary | ICD-10-CM | POA: Diagnosis not present

## 2021-08-27 DIAGNOSIS — S233XXA Sprain of ligaments of thoracic spine, initial encounter: Secondary | ICD-10-CM | POA: Diagnosis not present

## 2021-08-27 DIAGNOSIS — G43001 Migraine without aura, not intractable, with status migrainosus: Secondary | ICD-10-CM | POA: Diagnosis not present

## 2021-08-27 DIAGNOSIS — S338XXA Sprain of other parts of lumbar spine and pelvis, initial encounter: Secondary | ICD-10-CM | POA: Diagnosis not present

## 2021-09-04 DIAGNOSIS — J309 Allergic rhinitis, unspecified: Secondary | ICD-10-CM | POA: Diagnosis not present

## 2021-09-10 DIAGNOSIS — G5750 Tarsal tunnel syndrome, unspecified lower limb: Secondary | ICD-10-CM | POA: Diagnosis not present

## 2021-09-10 DIAGNOSIS — M79671 Pain in right foot: Secondary | ICD-10-CM | POA: Diagnosis not present

## 2021-09-10 DIAGNOSIS — M25579 Pain in unspecified ankle and joints of unspecified foot: Secondary | ICD-10-CM | POA: Diagnosis not present

## 2021-09-14 DIAGNOSIS — M25579 Pain in unspecified ankle and joints of unspecified foot: Secondary | ICD-10-CM | POA: Diagnosis not present

## 2021-09-14 DIAGNOSIS — M722 Plantar fascial fibromatosis: Secondary | ICD-10-CM | POA: Diagnosis not present

## 2021-09-14 DIAGNOSIS — M1990 Unspecified osteoarthritis, unspecified site: Secondary | ICD-10-CM | POA: Diagnosis not present

## 2021-09-14 DIAGNOSIS — M79671 Pain in right foot: Secondary | ICD-10-CM | POA: Diagnosis not present

## 2021-10-07 DIAGNOSIS — S233XXA Sprain of ligaments of thoracic spine, initial encounter: Secondary | ICD-10-CM | POA: Diagnosis not present

## 2021-10-07 DIAGNOSIS — S338XXA Sprain of other parts of lumbar spine and pelvis, initial encounter: Secondary | ICD-10-CM | POA: Diagnosis not present

## 2021-10-07 DIAGNOSIS — G43001 Migraine without aura, not intractable, with status migrainosus: Secondary | ICD-10-CM | POA: Diagnosis not present

## 2021-10-15 DIAGNOSIS — M25561 Pain in right knee: Secondary | ICD-10-CM | POA: Diagnosis not present

## 2021-10-20 DIAGNOSIS — M25561 Pain in right knee: Secondary | ICD-10-CM | POA: Diagnosis not present

## 2021-10-21 DIAGNOSIS — Z0000 Encounter for general adult medical examination without abnormal findings: Secondary | ICD-10-CM | POA: Diagnosis not present

## 2021-10-21 DIAGNOSIS — F419 Anxiety disorder, unspecified: Secondary | ICD-10-CM | POA: Diagnosis not present

## 2021-10-21 DIAGNOSIS — M1711 Unilateral primary osteoarthritis, right knee: Secondary | ICD-10-CM | POA: Diagnosis not present

## 2021-10-21 DIAGNOSIS — Z6829 Body mass index (BMI) 29.0-29.9, adult: Secondary | ICD-10-CM | POA: Diagnosis not present

## 2021-10-21 DIAGNOSIS — Z111 Encounter for screening for respiratory tuberculosis: Secondary | ICD-10-CM | POA: Diagnosis not present

## 2021-10-29 DIAGNOSIS — S233XXA Sprain of ligaments of thoracic spine, initial encounter: Secondary | ICD-10-CM | POA: Diagnosis not present

## 2021-10-29 DIAGNOSIS — G43001 Migraine without aura, not intractable, with status migrainosus: Secondary | ICD-10-CM | POA: Diagnosis not present

## 2021-10-29 DIAGNOSIS — S338XXA Sprain of other parts of lumbar spine and pelvis, initial encounter: Secondary | ICD-10-CM | POA: Diagnosis not present

## 2021-11-04 DIAGNOSIS — Z23 Encounter for immunization: Secondary | ICD-10-CM | POA: Diagnosis not present

## 2021-11-04 DIAGNOSIS — M722 Plantar fascial fibromatosis: Secondary | ICD-10-CM | POA: Diagnosis not present

## 2021-11-04 DIAGNOSIS — M25579 Pain in unspecified ankle and joints of unspecified foot: Secondary | ICD-10-CM | POA: Diagnosis not present

## 2021-11-04 DIAGNOSIS — M79671 Pain in right foot: Secondary | ICD-10-CM | POA: Diagnosis not present

## 2021-11-04 DIAGNOSIS — M1990 Unspecified osteoarthritis, unspecified site: Secondary | ICD-10-CM | POA: Diagnosis not present

## 2021-11-17 DIAGNOSIS — H5213 Myopia, bilateral: Secondary | ICD-10-CM | POA: Diagnosis not present

## 2021-11-18 DIAGNOSIS — M1711 Unilateral primary osteoarthritis, right knee: Secondary | ICD-10-CM | POA: Diagnosis not present

## 2021-11-23 DIAGNOSIS — S233XXA Sprain of ligaments of thoracic spine, initial encounter: Secondary | ICD-10-CM | POA: Diagnosis not present

## 2021-11-23 DIAGNOSIS — S338XXA Sprain of other parts of lumbar spine and pelvis, initial encounter: Secondary | ICD-10-CM | POA: Diagnosis not present

## 2021-11-23 DIAGNOSIS — G43001 Migraine without aura, not intractable, with status migrainosus: Secondary | ICD-10-CM | POA: Diagnosis not present

## 2021-11-25 DIAGNOSIS — M1711 Unilateral primary osteoarthritis, right knee: Secondary | ICD-10-CM | POA: Diagnosis not present

## 2021-12-02 DIAGNOSIS — M79671 Pain in right foot: Secondary | ICD-10-CM | POA: Diagnosis not present

## 2021-12-02 DIAGNOSIS — M722 Plantar fascial fibromatosis: Secondary | ICD-10-CM | POA: Diagnosis not present

## 2021-12-02 DIAGNOSIS — M1990 Unspecified osteoarthritis, unspecified site: Secondary | ICD-10-CM | POA: Diagnosis not present

## 2021-12-02 DIAGNOSIS — M25579 Pain in unspecified ankle and joints of unspecified foot: Secondary | ICD-10-CM | POA: Diagnosis not present

## 2021-12-02 DIAGNOSIS — M1711 Unilateral primary osteoarthritis, right knee: Secondary | ICD-10-CM | POA: Diagnosis not present

## 2021-12-03 DIAGNOSIS — G43001 Migraine without aura, not intractable, with status migrainosus: Secondary | ICD-10-CM | POA: Diagnosis not present

## 2021-12-03 DIAGNOSIS — S338XXA Sprain of other parts of lumbar spine and pelvis, initial encounter: Secondary | ICD-10-CM | POA: Diagnosis not present

## 2021-12-03 DIAGNOSIS — S233XXA Sprain of ligaments of thoracic spine, initial encounter: Secondary | ICD-10-CM | POA: Diagnosis not present

## 2022-01-21 DIAGNOSIS — G43001 Migraine without aura, not intractable, with status migrainosus: Secondary | ICD-10-CM | POA: Diagnosis not present

## 2022-01-21 DIAGNOSIS — S338XXA Sprain of other parts of lumbar spine and pelvis, initial encounter: Secondary | ICD-10-CM | POA: Diagnosis not present

## 2022-01-21 DIAGNOSIS — S233XXA Sprain of ligaments of thoracic spine, initial encounter: Secondary | ICD-10-CM | POA: Diagnosis not present

## 2022-03-25 DIAGNOSIS — F419 Anxiety disorder, unspecified: Secondary | ICD-10-CM | POA: Diagnosis not present

## 2022-03-25 DIAGNOSIS — Z1389 Encounter for screening for other disorder: Secondary | ICD-10-CM | POA: Diagnosis not present

## 2022-03-25 DIAGNOSIS — Z6829 Body mass index (BMI) 29.0-29.9, adult: Secondary | ICD-10-CM | POA: Diagnosis not present

## 2022-03-25 DIAGNOSIS — Z1331 Encounter for screening for depression: Secondary | ICD-10-CM | POA: Diagnosis not present

## 2022-03-30 DIAGNOSIS — Z113 Encounter for screening for infections with a predominantly sexual mode of transmission: Secondary | ICD-10-CM | POA: Diagnosis not present

## 2022-03-30 DIAGNOSIS — Z1151 Encounter for screening for human papillomavirus (HPV): Secondary | ICD-10-CM | POA: Diagnosis not present

## 2022-03-30 DIAGNOSIS — N898 Other specified noninflammatory disorders of vagina: Secondary | ICD-10-CM | POA: Diagnosis not present

## 2022-03-30 DIAGNOSIS — Z124 Encounter for screening for malignant neoplasm of cervix: Secondary | ICD-10-CM | POA: Diagnosis not present

## 2022-03-30 DIAGNOSIS — Z01419 Encounter for gynecological examination (general) (routine) without abnormal findings: Secondary | ICD-10-CM | POA: Diagnosis not present

## 2022-03-30 DIAGNOSIS — Z1389 Encounter for screening for other disorder: Secondary | ICD-10-CM | POA: Diagnosis not present
